# Patient Record
Sex: Male | Born: 1970 | Race: White | Hispanic: No | Marital: Single | Smoking: Never smoker
Health system: Southern US, Community
[De-identification: ages and names within clinical notes are randomized; demographics above are authoritative.]

## PROBLEM LIST (undated history)

## (undated) DIAGNOSIS — E785 Hyperlipidemia, unspecified: Secondary | ICD-10-CM

## (undated) DIAGNOSIS — C6290 Malignant neoplasm of unspecified testis, unspecified whether descended or undescended: Secondary | ICD-10-CM

## (undated) DIAGNOSIS — R1110 Vomiting, unspecified: Secondary | ICD-10-CM

## (undated) DIAGNOSIS — B9681 Helicobacter pylori [H. pylori] as the cause of diseases classified elsewhere: Secondary | ICD-10-CM

## (undated) DIAGNOSIS — K2970 Gastritis, unspecified, without bleeding: Secondary | ICD-10-CM

## (undated) DIAGNOSIS — J309 Allergic rhinitis, unspecified: Secondary | ICD-10-CM

## (undated) DIAGNOSIS — K589 Irritable bowel syndrome without diarrhea: Secondary | ICD-10-CM

## (undated) DIAGNOSIS — F419 Anxiety disorder, unspecified: Secondary | ICD-10-CM

## (undated) DIAGNOSIS — IMO0002 Reserved for concepts with insufficient information to code with codable children: Secondary | ICD-10-CM

## (undated) DIAGNOSIS — K219 Gastro-esophageal reflux disease without esophagitis: Secondary | ICD-10-CM

## (undated) DIAGNOSIS — Z8679 Personal history of other diseases of the circulatory system: Secondary | ICD-10-CM

## (undated) DIAGNOSIS — F79 Unspecified intellectual disabilities: Secondary | ICD-10-CM

## (undated) DIAGNOSIS — IMO0001 Reserved for inherently not codable concepts without codable children: Secondary | ICD-10-CM

## (undated) DIAGNOSIS — F909 Attention-deficit hyperactivity disorder, unspecified type: Secondary | ICD-10-CM

## (undated) HISTORY — DX: Hyperlipidemia, unspecified: E78.5

## (undated) HISTORY — DX: Unspecified intellectual disabilities: F79

## (undated) HISTORY — DX: Malignant neoplasm of unspecified testis, unspecified whether descended or undescended: C62.90

## (undated) HISTORY — DX: Helicobacter pylori (H. pylori) as the cause of diseases classified elsewhere: B96.81

## (undated) HISTORY — DX: Personal history of other diseases of the circulatory system: Z86.79

## (undated) HISTORY — DX: Allergic rhinitis, unspecified: J30.9

## (undated) HISTORY — DX: Helicobacter pylori (H. pylori) as the cause of diseases classified elsewhere: K29.70

## (undated) HISTORY — DX: Irritable bowel syndrome, unspecified: K58.9

## (undated) HISTORY — DX: Attention-deficit hyperactivity disorder, unspecified type: F90.9

## (undated) HISTORY — DX: Anxiety disorder, unspecified: F41.9

## (undated) HISTORY — DX: Reserved for inherently not codable concepts without codable children: IMO0001

## (undated) HISTORY — PX: ESOPHAGOGASTRODUODENOSCOPY: SHX1529

## (undated) HISTORY — DX: Reserved for concepts with insufficient information to code with codable children: IMO0002

## (undated) HISTORY — PX: COLONOSCOPY: SHX174

## (undated) HISTORY — DX: Gastro-esophageal reflux disease without esophagitis: K21.9

## (undated) HISTORY — DX: Vomiting, unspecified: R11.10

---

## 1994-10-27 HISTORY — PX: NASAL SEPTUM SURGERY: SHX37

## 2002-09-21 ENCOUNTER — Encounter (INDEPENDENT_AMBULATORY_CARE_PROVIDER_SITE_OTHER): Payer: Self-pay | Admitting: Specialist

## 2002-09-21 ENCOUNTER — Ambulatory Visit (HOSPITAL_COMMUNITY): Admission: RE | Admit: 2002-09-21 | Discharge: 2002-09-21 | Payer: Self-pay | Admitting: Gastroenterology

## 2007-10-07 ENCOUNTER — Ambulatory Visit: Payer: Self-pay | Admitting: Internal Medicine

## 2007-10-07 LAB — CONVERTED CEMR LAB
AST: 15 units/L (ref 0–37)
Albumin: 4.1 g/dL (ref 3.5–5.2)
Alkaline Phosphatase: 46 units/L (ref 39–117)
BUN: 7 mg/dL (ref 6–23)
Basophils Absolute: 0.2 10*3/uL — ABNORMAL HIGH (ref 0.0–0.1)
CRP, High Sensitivity: 1 (ref 0.00–5.00)
Chloride: 106 meq/L (ref 96–112)
Eosinophils Absolute: 0 10*3/uL (ref 0.0–0.6)
GFR calc non Af Amer: 91 mL/min
HCT: 41.1 % (ref 39.0–52.0)
MCHC: 34.4 g/dL (ref 30.0–36.0)
MCV: 83.9 fL (ref 78.0–100.0)
Monocytes Relative: 2.5 % — ABNORMAL LOW (ref 3.0–11.0)
Neutrophils Relative %: 85.2 % — ABNORMAL HIGH (ref 43.0–77.0)
Platelets: 204 10*3/uL (ref 150–400)
RBC: 4.9 M/uL (ref 4.22–5.81)
RDW: 12.5 % (ref 11.5–14.6)
Sed Rate: 7 mm/hr (ref 0–20)
Sodium: 142 meq/L (ref 135–145)

## 2007-10-18 ENCOUNTER — Encounter: Payer: Self-pay | Admitting: Internal Medicine

## 2007-10-18 ENCOUNTER — Ambulatory Visit: Payer: Self-pay | Admitting: Internal Medicine

## 2007-10-22 DIAGNOSIS — F79 Unspecified intellectual disabilities: Secondary | ICD-10-CM

## 2007-10-22 DIAGNOSIS — I10 Essential (primary) hypertension: Secondary | ICD-10-CM | POA: Insufficient documentation

## 2007-10-22 DIAGNOSIS — F329 Major depressive disorder, single episode, unspecified: Secondary | ICD-10-CM | POA: Insufficient documentation

## 2007-11-10 ENCOUNTER — Ambulatory Visit: Payer: Self-pay | Admitting: Cardiovascular Disease

## 2007-11-23 ENCOUNTER — Ambulatory Visit (HOSPITAL_COMMUNITY): Admission: RE | Admit: 2007-11-23 | Discharge: 2007-11-23 | Payer: Self-pay | Admitting: Internal Medicine

## 2007-12-02 ENCOUNTER — Ambulatory Visit: Payer: Self-pay | Admitting: Internal Medicine

## 2007-12-02 DIAGNOSIS — A048 Other specified bacterial intestinal infections: Secondary | ICD-10-CM | POA: Insufficient documentation

## 2008-09-18 ENCOUNTER — Ambulatory Visit (HOSPITAL_BASED_OUTPATIENT_CLINIC_OR_DEPARTMENT_OTHER): Admission: RE | Admit: 2008-09-18 | Discharge: 2008-09-18 | Payer: Self-pay | Admitting: Urology

## 2008-09-18 ENCOUNTER — Encounter (INDEPENDENT_AMBULATORY_CARE_PROVIDER_SITE_OTHER): Payer: Self-pay | Admitting: Urology

## 2008-09-18 HISTORY — PX: ORCHIECTOMY: SHX2116

## 2008-09-27 ENCOUNTER — Ambulatory Visit: Admission: RE | Admit: 2008-09-27 | Discharge: 2008-12-26 | Payer: Self-pay | Admitting: Radiation Oncology

## 2008-10-05 ENCOUNTER — Ambulatory Visit (HOSPITAL_COMMUNITY): Admission: RE | Admit: 2008-10-05 | Discharge: 2008-10-05 | Payer: Self-pay | Admitting: Radiation Oncology

## 2009-04-05 ENCOUNTER — Ambulatory Visit (HOSPITAL_COMMUNITY): Admission: RE | Admit: 2009-04-05 | Discharge: 2009-04-05 | Payer: Self-pay | Admitting: Radiation Oncology

## 2009-09-26 ENCOUNTER — Ambulatory Visit (HOSPITAL_COMMUNITY): Admission: RE | Admit: 2009-09-26 | Discharge: 2009-09-26 | Payer: Self-pay | Admitting: Radiation Oncology

## 2009-09-26 ENCOUNTER — Ambulatory Visit: Admission: RE | Admit: 2009-09-26 | Discharge: 2009-09-26 | Payer: Self-pay | Admitting: Radiation Oncology

## 2009-09-28 LAB — BETA HCG QUANT (REF LAB): Beta hCG, Tumor Marker: <0.5 m[IU]/mL (ref <5.0)

## 2010-03-08 ENCOUNTER — Ambulatory Visit: Admission: RE | Admit: 2010-03-08 | Discharge: 2010-03-08 | Payer: Self-pay | Admitting: Radiation Oncology

## 2010-03-08 LAB — URINALYSIS, MICROSCOPIC - CHCC
Ketones: NEGATIVE mg/dL
Leukocyte Esterase: NEGATIVE
Nitrite: NEGATIVE
Protein: NEGATIVE mg/dL
RBC count: NEGATIVE (ref 0–2)

## 2010-03-10 LAB — AFP TUMOR MARKER: AFP-Tumor Marker: 4.7 ng/mL (ref 0.0–8.0)

## 2010-03-10 LAB — BETA HCG QUANT (REF LAB): Beta hCG, Tumor Marker: 0.5 m[IU]/mL (ref <5.0)

## 2010-08-30 ENCOUNTER — Other Ambulatory Visit: Payer: Self-pay | Admitting: Radiation Oncology

## 2010-11-17 ENCOUNTER — Encounter: Payer: Self-pay | Admitting: Radiation Oncology

## 2011-02-27 ENCOUNTER — Ambulatory Visit: Payer: Medicaid Other | Attending: Radiation Oncology | Admitting: Radiation Oncology

## 2011-03-11 NOTE — Assessment & Plan Note (Signed)
[name] [name]                         GASTROENTEROLOGY OFFICE NOTE   NAME:[name], [name] HALLEY                         MRN:          [id number]  DATE:10/07/2007                            DOB:          Jan 28, 1971    CHIEF COMPLAINT:  Vomiting, diarrhea weight loss.   ASSESSMENT:  A 40 year old white man with chronic diarrhea, and now with  intermittent but persistent vomiting and weight loss. The etiology is  not entirely clear. The patient is mentally retarded and his mother is  his guardian. It is possible he had some sore of eating disorder or self-  induced vomiting but he had chronic diarrhea prior to that. His  grandmother had Crohn's disease and I am concerned that that may be  possible.   PLAN:  Schedule upper GI endoscopy and colonoscopy to look for reflux  problems, to look for Crohn's disease or other causes of these symptoms.  Ulcerative colitis is possible though I would doubt he would have the  nausea and vomiting. Note that I did not see any trauma to his  fingernails or his hard palate to suggest self-induced vomiting though  that remains a possibility. We will also check a CBC, CMET, TSH, C  reactive protein today.   HISTORY:  This is a 40 year old white man followed by Dr. Elias [name].  His mom relates that in the last year or so he started going to [name] for  classes to maintain his activities of daily living skills, etc. He had  always been self conscious about his weight and he was obese. He started  having nausea and vomiting. It was intermittent. Multiple attempts to  try to observe him inducing vomiting did not result in demonstration of  that. His mother started taking him to work with her on some days. He  did gain some weight after that though he is down about 40 pounds since  April. There apparently is another student in his classes that had this  sort of problem where he was self-inducing vomiting or had an eating  disorder.  There is no bleeding from the rectum or in the vomit. He has  had chronic diarrhea for years and has been managed as irritable bowel  syndrome reasonably well by Dr. Nicholos [name]. His grandmother had Crohn's  disease and was cared for by Dr. Corinda [name]. The patient's mother does have  irritable bowel syndrome. He has pill dysphagia and his throat feels  sore at times but there is no esophageal food dysphagia. He did have a  history of binging on food in years past.   PAST MEDICAL HISTORY:  Hypertension, depression (he is upset over his  grandfather passing away last evening), nasal septal surgery, mental  retardation.   FAMILY HISTORY:  As above.   MEDICATIONS:  1. Prevacid 30 mg daily.  2. Ranitidine 300 mg q.h.s.  3. Benicar 40/12.5 mg daily.  There are no known drug allergies.   SOCIAL HISTORY:  He lives with his mom. No alcohol, tobacco or drugs.   REVIEW OF SYSTEMS:  See medical history form. He does have some  cough.  He is a little anxious and upset over his grandfather's death. See  medical history form for further details.   PHYSICAL EXAMINATION:  Reveals a pleasant, well-developed, young, white  man.  Height 6 feet, weight 222 pounds. Blood pressure 138/92, pulse 60.  EYES:  Anicteric.  ENT:  I see a normal mouth and posterior pharynx. I see no trauma on the  palate.  NECK:  Supple, no thyromegaly or mass.  CHEST:  Clear.  HEART:  S1, S2, no gallops.  ABDOMEN:  Soft. There is striae present. There is no organomegaly or  mass. Nontender.  RECTAL:  Inspection of the rectal area shows no significant  abnormalities though it was a little hard to open the buttocks and  really see the anal area. Digital rectal exam is deferred.  LYMPHATIC:  No neck or supraclavicular or groin nodes detected.  SKIN:  Some acne in the back otherwise negative.  NEURO/PSYCHE:  He is awake and alert, grossly nonfocal neurologically.   I appreciate the opportunity to care for this patient.     Iva Boop, MD,FACG  Electronically Signed    CEG/MedQ  DD: 10/07/2007  DT: 10/08/2007  Job #: [id number]

## 2011-03-11 NOTE — Assessment & Plan Note (Signed)
[facility]                         GASTROENTEROLOGY OFFICE NOTE   NAME:[name] [name], [name] [name]                         MRN:          [id number]  DATE:12/02/2007                            DOB:          [birth year]    CHIEF COMPLAINT:  Followup of weight-loss, vomiting.   [name] continues to have some vomiting.  He has had chronic diarrhea  prior to all this.  His weight is down 15 pounds from when I saw him in  September.  He is here with his mom.  He says he is doing better.  However, she says he goes for days without eating, though he is  drinking.  I had seen him first on December 11.  Subsequently, he has  had an EGD with some thickened proximal folds that showed H. pylori  gastritis.  He had a colonoscopy, normal into the terminal ileum.  Random biopsies of the colon and the ileum were normal.  Because of the  weight-loss and problems, a CT of the abdomen and pelvis was performed  and it showed some liver lesions and it was not clear what they were and  an MRI was performed that showed that these were consistent with  hemangiomas.  His pancreas and other organs look fine.  There was some  thickening of the gastric wall and the colon on some of these studies,  but between those studies and the colonoscopy and EGD, it is concluded  that there really is no significant abnormality there.   MEDICATIONS:  1. Remain Prevacid 300 mg daily.  2. Ranitidine 300 mg q.h.s.  3. Benicar 40/12.5 mg daily, which is on hold.  4. Flonase 120 mcg p.r.n.   ALLERGIES:  There are no known drug allergies.   PHYSICAL EXAM:  Weight 207 pounds, pulse 56, blood pressure 132/90.   ASSESSMENT:  1. Helicobacter pylori gastritis.  We will treat that with Prevacid      b.i.d. and amoxicillin and Biaxin 1000 mg and 500 mg b.i.d.,      respectively, for ten days.  This may help, but I told his mom I      doubt that would fix everything.  2. Persistent vomiting.  3. Persistent  weight-loss.  4. Irritable bowel syndrome, diarrhea-predominant, seems most likely.      Given all the imaging studies we have had, I do not think further      workup of the diarrhea itself is needed.   I would like to him to go back to see Dr. Nicholos Johns and consider perhaps a  psychological or psychiatric assessment for eating difficulty.  This is  very difficult in the face of his underlying mental retardation.  Note  that his grandfather died in 11/05/23, and I do not know what happened  prior to this with his illness, etc., but that may have been a trigger  for some sort of depression.  I can see him back after he sees Dr.  Nicholos Johns.  Again, I doubt the H. pylori treatment  will fix everything.  Hopefully that will provide some benefit, but I  would not perform any GI-specific evaluation further at this time.     Iva Boop, MD,FACG  Electronically Signed    CEG/MedQ  DD: 12/02/2007  DT: 12/03/2007  Job #: [id number]   cc:   [name], M.D.

## 2011-03-11 NOTE — Op Note (Signed)
NAME:  [name] [name], [name] [name]                  ACCOUNT NO.:  [id number]   MEDICAL RECORD NO.:  [id number]          PATIENT TYPE:  AMB   LOCATION:  [facility]                         FACILITY:  [facility]   PHYSICIAN:  Sigmund I. Patsi Sears, M.D.DATE OF BIRTH:  Mar 23, 1971   DATE OF PROCEDURE:  09/18/2008  DATE OF DISCHARGE:                               OPERATIVE REPORT   PREOPERATIVE DIAGNOSIS:  Right testis mass.   POSTOPERATIVE DIAGNOSIS:  Germ cell tumor (frozen section).   OPERATION:  Right radical orchiectomy.   SURGEON:  Sigmund I. Patsi Sears, M.D.   ANESTHESIA:  General LMA.   OPERATION:  After appropriate preanesthesia, the patient was brought to  the operating room and placed on the operating room table in a dorsal  supine position where general LMA anesthesia was introduced.  He  remained in the supine position, where the inguinal area was shaved, and  prepped with Betadine solution and draped in usual fashion.   REVIEW OF HISTORY:  This 40 year old mentally challenged male was  referred by [name], M.D. at [facility], for evaluation of  right testicular mass.  Ultrasound at [facility] suggested  carcinoma.  Ultrasound in the office revealed a 4.4 x 2.93 x 2.8 cm  intratesticular mass.  The right testicle size was 5.3 x 3.04 x 3.47 cm.  The left testicle was normal.  The patient underwent laboratory  examination, with a beta hCG which was less than 1, and then alpha  fetoprotein which was 2.5 (normal less than 6.1).  He is now for right  radical orchiectomy.   PROCEDURE:  A 5 cm right inguinal incision was made with subcutaneous  tissue dissected.  Bleeding was controlled with the electrosurgical  unit.  The external oblique fascia was identified, incised sharply, and  dissected, in order to avoid injury to the ilioinguinal nerve.  Following this, the spermatic cord was identified at the level of the  pubic tubercle, and a Penrose drain was doubly placed around  the  spermatic cord, and locked in position.  The scrotum was dissected, and  testicle delivered into the wound.  The testicle was hard, as it had  appeared clinically.  The patient was previously marked on the right  side.  The spermatic cord was then dissected, and quadrupley clamped,  and the testicle amputated.  The 0.5 plain Marcaine was injected into  the spermatic cord, and the testicle was sent for frozen section  analysis.  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  Following this, the wound edges were injected  with Marcaine solution.  Following this, the clamps were ligated with  zero Vicryl suture, and also #1 nylon suture, which was left long, in  case future radical dissection was necessary.  No scrotal bleeding was  noted.  The external oblique fascia was then closed with running zero  Vicryl  suture, subcutaneous tissue was closed with 3-0 Vicryl suture, skin was  closed with running 4-0 Vicryl suture.  Dermabond was placed on the  wound.  The patient was given IV  Toradol, awakened, and taken recovery  room in good condition.      Sigmund I. Patsi Sears, M.D.  Electronically Signed     SIT/MEDQ  D:  09/18/2008  T:  09/18/2008  Job:  [id number]

## 2011-03-14 NOTE — Op Note (Signed)
NAME:  [name] [name], [name] [name]                            ACCOUNT NO.:  [id number]   MEDICAL RECORD NO.:  [id number]                   PATIENT TYPE:  AMB   LOCATION:  ENDO                                 FACILITY:  [facility]   PHYSICIAN:  Petra Kuba, M.D.                 DATE OF BIRTH:  1971/01/13   DATE OF PROCEDURE:  09/21/2002  DATE OF DISCHARGE:                                 OPERATIVE REPORT   PROCEDURE:  Esophagogastroduodenoscopy with biopsy.   INDICATIONS:  Atypical chest pain, upper tract symptoms.  Consent was signed  after risks, benefits, methods, and options thoroughly discussed with the  patient and his mother.   MEDICATIONS:  Demerol 100 mg, Versed 10 mg.   DESCRIPTION OF PROCEDURE:  The video endoscope was inserted by direct  vision.  The esophagus was normal.  In the distal esophagus was a tiny to  small hiatal hernia.  The scope passed into the stomach, advanced to the  antrum, through a normal pylorus, into a normal duodenal bulb, and around  the C-loop to a normal second portion of the duodenum.  The scope was  withdrawn back to the bulb, and a good look there ruled out ulcer in that  location.  The scope was withdrawn back to the stomach and retroflexed.  High in the cardia the finding of the small hiatal hernia was confirmed.  There was some proximal gastritis.  Angularis, lesser and greater curve were  normal except for some minimal gastritis.  The scope was straightened, and  straight visualization of the stomach confirmed the gastritis, ruled out any  additional lesions.  Air was suctioned, the scope was slowly withdrawn.  Again a good look at the esophagus was normal.  The scope was then  readvanced to the stomach.  Two biopsies of the antrum and two of the  proximal stomach were obtained to rule out Helicobacter.  Air was suctioned,  and the scope was slowly withdrawn.  Again a good look at the esophagus was  normal.  The scope was removed.  The patient tolerated  the procedure well.  There was no obvious immediate complication.   ENDOSCOPIC DIAGNOSES:  1. Tiny to small hiatal hernia.  2. Mild distal with moderate proximal gastritis, status post biopsy.  3. Otherwise normal EGD.   PLAN:  Protonix b.i.d.  Await pathology.  Minimize aspirin and  nonsteroidals.  Follow up p.r.n. or in two months.                                               Petra Kuba, M.D.    MEM/MEDQ  D:  09/21/2002  T:  09/21/2002  Job:  [id number]   cc:   [name], M.D.  [address]  [place]  [place] [address]  Fax: [phone]

## 2011-04-01 ENCOUNTER — Ambulatory Visit (HOSPITAL_COMMUNITY)
Admission: RE | Admit: 2011-04-01 | Discharge: 2011-04-01 | Disposition: A | Discharge status: Home / Self Care | Payer: Medicaid Other | Source: Ambulatory Visit | Attending: Urology | Admitting: Urology

## 2011-04-01 ENCOUNTER — Other Ambulatory Visit (HOSPITAL_COMMUNITY): Payer: Self-pay | Admitting: Urology

## 2011-04-01 DIAGNOSIS — R079 Chest pain, unspecified: Secondary | ICD-10-CM | POA: Insufficient documentation

## 2011-04-01 DIAGNOSIS — R52 Pain, unspecified: Secondary | ICD-10-CM

## 2011-05-08 ENCOUNTER — Other Ambulatory Visit: Payer: Self-pay | Admitting: Urology

## 2011-05-08 DIAGNOSIS — R1900 Intra-abdominal and pelvic swelling, mass and lump, unspecified site: Secondary | ICD-10-CM

## 2011-05-11 ENCOUNTER — Ambulatory Visit
Admission: RE | Admit: 2011-05-11 | Discharge: 2011-05-11 | Disposition: A | Discharge status: Home / Self Care | Payer: Medicaid Other | Source: Ambulatory Visit | Attending: Urology | Admitting: Urology

## 2011-05-11 DIAGNOSIS — R1900 Intra-abdominal and pelvic swelling, mass and lump, unspecified site: Secondary | ICD-10-CM

## 2011-05-11 MED ORDER — GADOBENATE DIMEGLUMINE 529 MG/ML IV SOLN
20.0000 mL | Freq: Once | INTRAVENOUS | Status: AC | PRN
  Administered 2011-05-11: 20 mL via INTRAVENOUS

## 2011-07-30 LAB — POCT HEMOGLOBIN-HEMACUE: Hemoglobin: 15.5

## 2012-03-02 ENCOUNTER — Encounter: Payer: Self-pay | Admitting: Radiation Oncology

## 2012-03-04 ENCOUNTER — Ambulatory Visit: Payer: Medicaid Other | Admitting: Radiation Oncology

## 2012-03-18 ENCOUNTER — Encounter: Payer: Self-pay | Admitting: Radiation Oncology

## 2012-03-18 ENCOUNTER — Ambulatory Visit
Admission: RE | Admit: 2012-03-18 | Discharge: 2012-03-18 | Disposition: A | Discharge status: Home / Self Care | Payer: Medicaid Other | Source: Ambulatory Visit | Attending: Radiation Oncology | Admitting: Radiation Oncology

## 2012-03-18 DIAGNOSIS — C6290 Malignant neoplasm of unspecified testis, unspecified whether descended or undescended: Secondary | ICD-10-CM

## 2012-03-18 DIAGNOSIS — C6291 Malignant neoplasm of right testis, unspecified whether descended or undescended: Secondary | ICD-10-CM | POA: Insufficient documentation

## 2012-03-18 DIAGNOSIS — K589 Irritable bowel syndrome without diarrhea: Secondary | ICD-10-CM

## 2012-03-18 NOTE — Patient Instructions (Signed)
May need AFP and BCG for surveillance

## 2012-03-18 NOTE — Progress Notes (Signed)
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 Denies pain.No problems per mother except 1 episode of urinary symptoms consistent with enlarged prostate.

## 2012-03-18 NOTE — Progress Notes (Signed)
  Radiation Oncology         [phone] ________________________________  Name: [name] [name] MRN: [id number]  Date: 03/18/2012  DOB: 10-08-1971  Follow-Up Visit Note  CC: No primary provider on file.  Jethro Bolus I, *  Diagnosis:   41 year old gentleman with a history of right stage I testicular seminoma  Interval Since Last Radiation:  40 months  Narrative:  The patient returns today for routine follow-up.  He is essentially without complaints. He did have some increased urinary frequency and hesitancy, but this has been better recently.                              ALLERGIES:   has no known allergies.  Meds: Current Outpatient Prescriptions  Medication Sig Dispense Refill  . fluticasone (FLONASE) 50 MCG/ACT nasal spray Place 2 sprays into the nose daily.      . lansoprazole (PREVACID) 30 MG capsule Take 30 mg by mouth daily.      . ranitidine (ZANTAC) 150 MG capsule Take 150 mg by mouth every evening.        Physical Findings: The patient is in no acute distress.  Patient is alert and oriented.The supraclavicular regions for adenopathy lungs are clear heart is regular abdomen is soft nontender. The patient does have visible striae along the abdomen presumably related to weight gain. The abdomen is benign. The inguinal canals are free of adenopathy. Phallus circumcised and unremarkable. The scrotum contains the left testicle which is smooth and nontender. Extremities are free of cyanosis clubbing or edema.. No significant changes.  Lab Findings: Lab Results  Component Value Date   WBC 8.0 10/07/2007   HGB 15.5 09/18/2008   HCT 41.1 10/07/2007   MCV 83.9 10/07/2007   PLT 204 10/07/2007   Impression:  The patient currently exhibits no clinical evidence to suggest recurrence.  Plan:  The patient will return to radiation oncology clinic for routine followup in one year. He will see Dr. Patsi Sears in the next week or so.  _____________________________________  Artist Pais. Kathrynn Running, M.D.

## 2012-04-21 ENCOUNTER — Other Ambulatory Visit (HOSPITAL_COMMUNITY): Payer: Self-pay | Admitting: Urology

## 2012-04-21 ENCOUNTER — Ambulatory Visit (HOSPITAL_COMMUNITY)
Admission: RE | Admit: 2012-04-21 | Discharge: 2012-04-21 | Disposition: A | Discharge status: Home / Self Care | Payer: Medicaid Other | Source: Ambulatory Visit | Attending: Urology | Admitting: Urology

## 2012-04-21 DIAGNOSIS — C6290 Malignant neoplasm of unspecified testis, unspecified whether descended or undescended: Secondary | ICD-10-CM | POA: Insufficient documentation

## 2013-03-24 ENCOUNTER — Ambulatory Visit: Payer: Medicaid Other | Admitting: Radiation Oncology

## 2013-05-05 ENCOUNTER — Encounter: Payer: Self-pay | Admitting: Radiation Oncology

## 2013-05-05 ENCOUNTER — Ambulatory Visit
Admission: RE | Admit: 2013-05-05 | Discharge: 2013-05-05 | Disposition: A | Discharge status: Home / Self Care | Payer: Medicaid Other | Source: Ambulatory Visit | Attending: Radiation Oncology | Admitting: Radiation Oncology

## 2013-05-05 VITALS — BP 146/94 | HR 61 | Temp 96.5°F | Resp 20 | Wt 231.0 lb

## 2013-05-05 DIAGNOSIS — C6290 Malignant neoplasm of unspecified testis, unspecified whether descended or undescended: Secondary | ICD-10-CM

## 2013-05-05 NOTE — Progress Notes (Signed)
  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  He is without complaint.  He has been exercising more and lifting weights.  He does experience diarrhea with certain foods.                              ALLERGIES:  has No Known Allergies.  Meds: Current Outpatient Prescriptions  Medication Sig Dispense Refill  . fluticasone (FLONASE) 50 MCG/ACT nasal spray Place 2 sprays into the nose daily.      . lansoprazole (PREVACID) 30 MG capsule Take 30 mg by mouth daily.      . ranitidine (ZANTAC) 150 MG capsule Take 150 mg by mouth every evening.       No current facility-administered medications for this encounter.    Physical Findings: The patient is in no acute distress. Patient is alert and oriented.  weight is 231 lb (104.781 kg). His temperature is 96.5 F (35.8 C). His blood pressure is 146/94 and his pulse is 61. His respiration is 20. . The supraclavicular region is free of adenopathy.  Lungs are clear.  Heart is regular.  Abdomen is protuberant, soft, nontender.  Radiation tattoos are visible and there are no other skin findings of note.  Bilateral groins are free of adenopathy.  The phallus is circumcised, unremarkable.  The scrotum contains left testicle which is generally large without distinct nodularity or tenderness.  The right hemiscrotum is empty.  There are no skin changes of the scrotum or groin. No significant changes.  Lab Findings: Lab Results  Component Value Date   WBC 8.0 10/07/2007   HGB 15.5 09/18/2008   HCT 41.1  10/07/2007   MCV 83.9 10/07/2007   PLT 204 10/07/2007    Impression:  The patient has no evidence of recurrence.  Plan:  He will return for follow-up in one year.  His mother asked about follow-up CT imaging today.  I reassured her that routine CT surveillance is not recommended by NCCN guidelines for patients who undergo radiation to the paraaortics and ipsilateral pelvis given the exceedingly low likelihood of recurrence in those nodal areas.  _____________________________________  Artist Pais. Kathrynn Running, M.D.

## 2013-05-05 NOTE — Progress Notes (Signed)
Pt's caregiver w/him today. Pt denies pain, urinary, bowel issues, fatigue, loss of appetite.

## 2013-06-08 ENCOUNTER — Telehealth: Payer: Self-pay | Admitting: Radiation Oncology

## 2013-06-08 NOTE — Telephone Encounter (Signed)
Returned message left by patient's mother. She reports she was reviewing her son's MyChart and found a note where Dr. Kathrynn Running recommended AFP and BCG but, know one has not been done since 2011. Informed the [name] this Clinical research associate would relay her concerns to Dr. Kathrynn Running. Also, explain what an AFP and BCG is. All questions answered. She verbalized understanding. Will contact patient's mother next week if Dr. Kathrynn Running wishes to repeat these labs.

## 2013-06-13 ENCOUNTER — Telehealth: Payer: Self-pay | Admitting: Radiation Oncology

## 2013-06-13 NOTE — Telephone Encounter (Signed)
Dr. Imelda Pillow office has obtained these tests annually. The most recent results were 04/27/13 which can be found in the patient's media tab, excerpted below.  MM   Phoned patient's mother and explained per Dr. Broadus John direction that Dr. Imelda Pillow office has obtained these tests annually. Explained the results were within normal limits. She verbalized understanding and expressed appreciation for the call and clarification.

## 2013-06-13 NOTE — Telephone Encounter (Signed)
Thanks Sam,  Dr. Imelda Pillow office has obtained these tests annually.  The most recent results were 04/27/13 which can be found in the patient's media tab, excerpted below.  MM

## 2014-06-01 ENCOUNTER — Encounter: Payer: Self-pay | Admitting: Radiation Oncology

## 2014-06-01 ENCOUNTER — Ambulatory Visit
Admission: RE | Admit: 2014-06-01 | Discharge: 2014-06-01 | Disposition: A | Discharge status: Home / Self Care | Payer: Medicaid Other | Source: Ambulatory Visit | Attending: Radiation Oncology | Admitting: Radiation Oncology

## 2014-06-01 VITALS — BP 151/90 | HR 61 | Resp 16 | Wt 207.4 lb

## 2014-06-01 DIAGNOSIS — C6291 Malignant neoplasm of right testis, unspecified whether descended or undescended: Secondary | ICD-10-CM

## 2014-06-01 NOTE — Progress Notes (Signed)
  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  Mother reports they are presently working with [name], PCP, on patient's new eating disorder. Reports the patient is purging food. Patient has lost 23 lb since last being seen one year ago. Patient reports he continues to exercise and lift weights. Blood pressure elevated today. Patient persistently complaining he got "too hot today at summer camp." Endoscopy scheduled for November                               ALLERGIES:  has No Known Allergies.  Meds: Current Outpatient Prescriptions  Medication Sig Dispense Refill  . fluticasone (FLONASE) 50 MCG/ACT nasal spray Place 2 sprays into the nose daily.      . lansoprazole (PREVACID) 30 MG capsule Take 30 mg by mouth daily.      . ranitidine (ZANTAC) 150 MG capsule Take 150 mg by mouth every evening.      . rosuvastatin (CRESTOR) 10 MG tablet Take 10 mg by mouth daily.       No current facility-administered medications for this encounter.    Physical Findings: The patient is in no acute distress. Patient is alert and oriented.  weight is 207 lb 6.4 oz (94.076 kg). His blood pressure is 151/90 and his pulse is 61. His respiration is 16. .The supraclavicular region is free of adenopathy. Lungs are clear. Heart is regular. Abdomen is protuberant, soft, nontender. Radiation tattoos are visible and there are no other skin findings of note. Bilateral groins are free of adenopathy. The phallus is circumcised, unremarkable. The scrotum contains left  testicle which is generally large without distinct nodularity or tenderness. The right hemiscrotum is empty. There are no skin changes of the scrotum or groin. No significant change  Lab Findings: Lab Results  Component Value Date   WBC 8.0 10/07/2007   HGB 15.5 09/18/2008   HCT 41.1 10/07/2007   MCV 83.9 10/07/2007   PLT 204 10/07/2007    Radiographic Findings: CT abd/pelvis on 7/24 at [facility] shows no recurrence.  Impression:  The patient has no evidence of recurrence.  Plan:  Follow-up in one year, preceded by lab work.  _____________________________________  Sheral Apley. Tammi Klippel, M.D.

## 2014-06-01 NOTE — Progress Notes (Signed)
Mother reports they are presently working with Dr. Mariea Clonts, PCP, on patient's new eating disorder. Reports the patient is purging food. Patient has lost 23 lb since last being seen one year ago. Patient reports he continues to exercise and lift weights. Blood pressure elevated today. Patient persistently complaining he got "too hot today at summer camp." Endoscopy scheduled for November.

## 2014-06-02 ENCOUNTER — Telehealth: Payer: Self-pay | Admitting: *Deleted

## 2014-06-02 NOTE — Telephone Encounter (Signed)
CALLED PATIENT TO INFORM OF LAB AND FU VISIT, LVM FOR A RETURN CALL

## 2014-06-16 ENCOUNTER — Encounter: Payer: Self-pay | Admitting: Internal Medicine

## 2014-08-17 ENCOUNTER — Ambulatory Visit (INDEPENDENT_AMBULATORY_CARE_PROVIDER_SITE_OTHER): Payer: Medicaid Other | Admitting: Internal Medicine

## 2014-08-17 ENCOUNTER — Encounter: Payer: Self-pay | Admitting: Internal Medicine

## 2014-08-17 VITALS — BP 136/100 | HR 72 | Ht 70.0 in | Wt 205.1 lb

## 2014-08-17 DIAGNOSIS — R634 Abnormal weight loss: Secondary | ICD-10-CM

## 2014-08-17 DIAGNOSIS — F508 Other eating disorders: Secondary | ICD-10-CM

## 2014-08-17 DIAGNOSIS — K589 Irritable bowel syndrome without diarrhea: Secondary | ICD-10-CM

## 2014-08-17 DIAGNOSIS — F5089 Other specified eating disorder: Secondary | ICD-10-CM

## 2014-08-18 ENCOUNTER — Encounter: Payer: Self-pay | Admitting: Internal Medicine

## 2014-08-18 DIAGNOSIS — K589 Irritable bowel syndrome without diarrhea: Secondary | ICD-10-CM | POA: Insufficient documentation

## 2014-08-18 DIAGNOSIS — R634 Abnormal weight loss: Secondary | ICD-10-CM | POA: Insufficient documentation

## 2014-08-18 DIAGNOSIS — F5089 Other specified eating disorder: Secondary | ICD-10-CM | POA: Insufficient documentation

## 2014-08-18 NOTE — Progress Notes (Signed)
   Subjective:    Patient ID: [name] [name], male    DOB: 15-Jul-1971, 43 y.o.   MRN: [id number]  HPI 43 yo developmentaly delayed man here with mother. He has hx GERD and IBS. She reports he is inducing vomiting after eating about 50% of time and is losing weight. Fr. Alyson Ingles is helping with finding a Careers information officer. Mom wants to know if there is specific GI danger from this behavior plus if there is anything I could recommend to help with problem an stop it,  He also has chronic diarrhea. He has had at least 2 EGD (2003 and 2008) - H. Pylori gastritis Tx 2008 Colonoscopy/ileoscopy 2008 negative including colon bx  He is losing weight No Known Allergies Outpatient Prescriptions Prior to Visit  Medication Sig Dispense Refill  . fluticasone (FLONASE) 50 MCG/ACT nasal spray Place 2 sprays into the nose daily.      . lansoprazole (PREVACID) 30 MG capsule Take 30 mg by mouth daily.      . ranitidine (ZANTAC) 150 MG capsule Take 150 mg by mouth every evening.      . rosuvastatin (CRESTOR) 10 MG tablet Take 10 mg by mouth daily.       No facility-administered medications prior to visit.   Past Medical History  Diagnosis Date  . Mentally challenged   . H/O: hypertension   . Radiation 09/27/08-11/09/2008    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  . GERD (gastroesophageal reflux disease)   . Anxiety   . Testicular cancer     s/p orchiectomy  . Allergic rhinitis   . 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 HLD (hyperlipidemia)   . Helicobacter pylori gastritis     hx of  . IBS (irritable bowel syndrome)   . Vomiting     self-induced   Past Surgical History  Procedure Laterality Date  . Nasal septum surgery  1996    x 2  . Orchiectomy  09/18/2008    right inguinal   . Colonoscopy    . Esophagogastroduodenoscopy           Review of Systems As above    Objective:   Physical Exam General:  NAD Eyes:   anicteric Lungs:  clear Heart:  S1S2 no rubs, murmurs or  gallops Abdomen:  soft and nontender, BS+ Ext:   no edema    Data Reviewed:   Wt Readings from Last 3 Encounters:  08/17/14 205 lb 2 oz (93.044 kg)  06/01/14 207 lb 6.4 oz (94.076 kg)  05/05/13 231 lb (104.781 kg)      Weight in 2009 was 207#    Assessment & Plan:  Self induced vomiting - chronic Chronic and recurrent and behavioral. Should follow-through with therapists recommended by [name]. Challenging problem. ? If some/all centers around fear of not being with mom.  IBS (irritable bowel syndrome) Episodic chronic diarrhea. Colon bxs negative in past.  Loss of weight His weight is where he was in 2009 - though was higher last year. This is coming from self-induced vomiting. Hopefully behavioral therapy can help.    No need for endoscopic evaluation. See me prn Cc: Vena Austria, MD

## 2014-08-18 NOTE — Assessment & Plan Note (Signed)
Episodic chronic diarrhea. Colon bxs negative in past.

## 2014-08-18 NOTE — Assessment & Plan Note (Signed)
His weight is where he was in 2009 - though was higher last year. This is coming from self-induced vomiting. Hopefully behavioral therapy can help.

## 2014-08-18 NOTE — Assessment & Plan Note (Signed)
Chronic and recurrent and behavioral. Should follow-through with therapists recommended by [name]. Challenging problem. ? If some/all centers around fear of not being with mom.

## 2015-06-06 ENCOUNTER — Ambulatory Visit
Admission: RE | Admit: 2015-06-06 | Discharge: 2015-06-06 | Disposition: A | Discharge status: Home / Self Care | Payer: Medicaid Other | Source: Ambulatory Visit | Attending: Radiation Oncology | Admitting: Radiation Oncology

## 2015-06-06 ENCOUNTER — Other Ambulatory Visit: Payer: Self-pay | Admitting: Radiation Oncology

## 2015-06-06 DIAGNOSIS — C6291 Malignant neoplasm of right testis, unspecified whether descended or undescended: Secondary | ICD-10-CM | POA: Diagnosis not present

## 2015-06-06 LAB — LACTATE DEHYDROGENASE (CC13): LDH: 192 U/L (ref 125–245)

## 2015-06-07 ENCOUNTER — Encounter: Payer: Self-pay | Admitting: Radiation Oncology

## 2015-06-07 ENCOUNTER — Ambulatory Visit
Admission: RE | Admit: 2015-06-07 | Discharge: 2015-06-07 | Disposition: A | Discharge status: Home / Self Care | Payer: Medicaid Other | Source: Ambulatory Visit | Attending: Radiation Oncology | Admitting: Radiation Oncology

## 2015-06-07 VITALS — BP 159/98 | HR 98 | Temp 98.0°F | Resp 20 | Ht 70.0 in | Wt 223.6 lb

## 2015-06-07 DIAGNOSIS — C6291 Malignant neoplasm of right testis, unspecified whether descended or undescended: Secondary | ICD-10-CM

## 2015-06-07 LAB — AFP TUMOR MARKER: AFP-Tumor Marker: 5.2 ng/mL (ref <6.1)

## 2015-06-07 LAB — HCG, SERUM, QUALITATIVE: PREG SERUM: NEGATIVE

## 2015-06-07 NOTE — Progress Notes (Signed)
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 S/p paraaortic radiotherapy to 22.5 Gy in 15 fractions from 10/17/2008-11/09/2008   Narrative:  The patient returns today for routine follow-up. C/o of leg pain. No c/o of nausea. Regular bowels. No c/o of voiding. Drinks gatoraid, tea, and water. Good appetite. C/o leg muscle pain that he attributes to his new cholesterol medication. He will call his Primary MD, [name], about it.                              ALLERGIES:  has No Known Allergies.  Meds: Current Outpatient Prescriptions  Medication Sig Dispense Refill  . atorvastatin (LIPITOR) 10 MG tablet Take 10 mg by mouth daily.  0  . fluticasone (FLONASE) 50 MCG/ACT nasal spray Place 2 sprays into the nose daily.    . lansoprazole (PREVACID) 30 MG capsule Take 30 mg by mouth daily.    . ranitidine (ZANTAC) 150 MG capsule Take 150 mg by mouth every evening.    Marland Kitchen LORazepam (ATIVAN) 0.5 MG tablet Take 0.5 mg by mouth 2 (two) times daily.  0   No current facility-administered medications for this encounter.    Physical Findings: The patient is in no acute distress. Patient is alert and oriented.  height is 5\' 10"  (1.778 m) and weight is 223 lb 9.6 oz (101.424 kg). His oral temperature is 98 F (36.7 C). His blood pressure is 159/98 and his pulse is 98. His respiration is 20 and oxygen saturation is 98%.  The patient's head is normocephalic and atraumatic. The patient is neurologically grossly intact. Speech is fluent and articulate. The patient's mood and affect are appropriate. The supraclavicular region is free of adenopathy. Lungs are clear. Heart is regular. Abdomen is  protuberant, soft, nontender. Radiation tattoos are visible and there are no other skin findings of note. Bilateral groins are free of adenopathy. The phallus is circumcised, unremarkable. The scrotum contains left testicle which is generally large without distinct nodularity or tenderness. The right hemiscrotum is empty. There are no skin changes of the scrotum or groin. No significant change.  Lab Findings: Lab Results  Component Value Date   WBC 8.0 10/07/2007   HGB 15.5 09/18/2008   HCT 41.1 10/07/2007   MCV 83.9 10/07/2007   PLT 204 10/07/2007  Results for [name] [name], [name] [name] (MRN [id number]) as of 06/07/2015 18:29  Ref. Range 06/06/2015 14:39  LDH Latest Ref Range: 125-245 U/L 192  Results for [name] [name], [name] [name] (MRN [id number]) as of 06/07/2015 18:29  Ref. Range 06/06/2015 14:39  AFP Tumor Marker Latest Ref Range: <6.1 ng/mL 5.2  Results for [name] [name], [name] [name] (MRN [id number]) as of 06/07/2015 18:29  Ref. Range 08/30/2010 15:32  Beta hCG, Tumor Marker Latest Ref Range: < 5.0 mIU/mL 0.5  Results for [name] [name], [name] [name] (MRN [id number]) as of 06/07/2015 18:29  Ref. Range 06/06/2015 14:39  HCG, Serum Unknown NEG    Impression:  The patient has no evidence of recurrence.  His markers are negative.  His HCG was ordered as qualitative rather than quantitative, but, it was negative regardless.  Plan:  Continue annual follow ups. Alternate with urology. Therefore, return here in two years for markers and visit.  This document serves as a record of services personally performed by Tyler Pita, MD. It was created on his behalf by Darcus Austin, a trained medical scribe. The creation of this record is based on the scribe's personal observations and the provider's statements to them. This document has been checked and approved by the attending provider.     _____________________________________  Sheral Apley. Tammi Klippel, M.D.

## 2015-06-07 NOTE — Progress Notes (Signed)
Follow up s/p rad txs 10/17/08-11/09/2008 right sided seminoma, no c/o pain or nausea,  Or pain, regular bowels, no c/o voiding, drinks gator aid ,tea and water, appetite good, c/o leg muscle pain, started new cholesterol medication 2 months ago, will call his Primary MD Dr. Mariea Clonts with [facility] 4:30 PM BP 159/98 mmHg  Pulse 98  Temp(Src) 98 F (36.7 C) (Oral)  Resp 20  Ht 5\' 10"  (1.778 m)  Wt 223 lb 9.6 oz (101.424 kg)  BMI 32.08 kg/m2  SpO2 98%  Wt Readings from Last 3 Encounters:  06/07/15 223 lb 9.6 oz (101.424 kg)  08/17/14 205 lb 2 oz (93.044 kg)  06/01/14 207 lb 6.4 oz (94.076 kg)

## 2016-06-12 ENCOUNTER — Telehealth: Payer: Self-pay | Admitting: Radiation Oncology

## 2016-06-12 ENCOUNTER — Ambulatory Visit
Admission: RE | Admit: 2016-06-12 | Discharge: 2016-06-12 | Disposition: A | Discharge status: Home / Self Care | Payer: Medicaid Other | Source: Ambulatory Visit | Attending: Radiation Oncology | Admitting: Radiation Oncology

## 2016-06-12 NOTE — Telephone Encounter (Signed)
Phoned Ms. Flye back. Explained that Dr. Tammi Klippel wants to get labs the day of the follow up and a separate appointment isn't required.

## 2016-06-12 NOTE — Progress Notes (Signed)
No show

## 2016-06-12 NOTE — Telephone Encounter (Signed)
Patient has not shown for 1615 yearly follow up appointment with Dr. Tammi Klippel. Phoned patient's mother, Britt Boozer, out of concern. She reports she forgot all about the appointment. Offered to reschedule. She prefers to call back once she has her day planner in front of her to reschedule. Confirmed Eren hasn't been seen by [facility] since 2015. She questions if Dr. Tammi Klippel wants to draw Argel's lab work prior to the appointment. She understands this RN will ask Dr. Tammi Klippel and call her back with further direction.

## 2016-06-13 ENCOUNTER — Ambulatory Visit: Admission: RE | Admit: 2016-06-13 | Payer: Medicaid Other | Source: Ambulatory Visit | Admitting: Radiation Oncology

## 2016-07-14 ENCOUNTER — Ambulatory Visit: Admission: RE | Admit: 2016-07-14 | Payer: Medicaid Other | Source: Ambulatory Visit | Admitting: Radiation Oncology

## 2016-07-14 ENCOUNTER — Telehealth: Payer: Self-pay | Admitting: Radiation Oncology

## 2016-07-14 NOTE — Telephone Encounter (Signed)
07/14/2016 Appointment canceled per patient / mother request. Per work  conflict.

## 2016-07-16 ENCOUNTER — Telehealth: Payer: Self-pay | Admitting: *Deleted

## 2016-07-30 NOTE — Progress Notes (Signed)
[name] [name] is here for an annual follow up visit for malignant neoplasm of the testis.  Denies pain or discomfort in the scrotum and groin area.   Having normal bowel and bladder patterns with occasional diarrhea or constipation.   Appetite is good.  Having fatigue some days when he has been very active.  Hcg drawn one year ago and saw Dr Gaynelle Arabian 06-07-15. Wt Readings from Last 3 Encounters:  07/31/16 221 lb 6.4 oz (100.4 kg)  06/07/15 223 lb 9.6 oz (101.4 kg)  08/17/14 205 lb 2 oz (93 kg)  BP (!) 147/108 (BP Location: Right Arm, Patient Position: Sitting, Cuff Size: Normal)   Pulse 62   Temp 97.8 F (36.6 C) (Oral)   Resp 18   Ht 5\' 10"  (1.778 m)   Wt 221 lb 6.4 oz (100.4 kg)   SpO2 100%   BMI 31.77 kg/m

## 2016-07-31 ENCOUNTER — Ambulatory Visit
Admission: RE | Admit: 2016-07-31 | Discharge: 2016-07-31 | Disposition: A | Discharge status: Home / Self Care | Payer: Medicaid Other | Source: Ambulatory Visit | Attending: Radiation Oncology | Admitting: Radiation Oncology

## 2016-07-31 ENCOUNTER — Encounter: Payer: Self-pay | Admitting: Radiation Oncology

## 2016-07-31 VITALS — BP 147/108 | HR 62 | Temp 97.8°F | Resp 18 | Ht 70.0 in | Wt 221.4 lb

## 2016-07-31 DIAGNOSIS — C6211 Malignant neoplasm of descended right testis: Secondary | ICD-10-CM

## 2016-07-31 DIAGNOSIS — C6291 Malignant neoplasm of right testis, unspecified whether descended or undescended: Secondary | ICD-10-CM | POA: Insufficient documentation

## 2016-07-31 DIAGNOSIS — I10 Essential (primary) hypertension: Secondary | ICD-10-CM | POA: Insufficient documentation

## 2016-07-31 DIAGNOSIS — E785 Hyperlipidemia, unspecified: Secondary | ICD-10-CM | POA: Diagnosis not present

## 2016-07-31 DIAGNOSIS — F99 Mental disorder, not otherwise specified: Secondary | ICD-10-CM | POA: Diagnosis not present

## 2016-07-31 DIAGNOSIS — K219 Gastro-esophageal reflux disease without esophagitis: Secondary | ICD-10-CM | POA: Diagnosis not present

## 2016-07-31 DIAGNOSIS — K589 Irritable bowel syndrome without diarrhea: Secondary | ICD-10-CM | POA: Diagnosis not present

## 2016-07-31 DIAGNOSIS — J309 Allergic rhinitis, unspecified: Secondary | ICD-10-CM | POA: Diagnosis not present

## 2016-07-31 DIAGNOSIS — F419 Anxiety disorder, unspecified: Secondary | ICD-10-CM | POA: Insufficient documentation

## 2016-07-31 DIAGNOSIS — F909 Attention-deficit hyperactivity disorder, unspecified type: Secondary | ICD-10-CM | POA: Insufficient documentation

## 2016-07-31 NOTE — Progress Notes (Addendum)
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 S/p paraaortic radiotherapy to 22.5 Gy in 15 fractions from 10/17/2008-11/09/2008   Narrative:  Mr. [name] [name] is here for an annual follow up visit for malignant neoplasm of the testis. He denies pain or discomfort in the scrotum and groin area. His mother asks that we do not use the term cancer for his care when discussing this in front of him.  Having normal bowel and bladder patterns with occasional diarrhea or constipation. Appetite is good. Having fatigue some days when he has been very active. Germ cell tumors were drawn last year and were negative for LDH, AFP, and bHCG.   On review of systems, the patient reports that he is doing well overall. He denies any chest pain, shortness of breath, cough, fevers, chills, night sweats, unintended weight changes. He denies any bowel or bladder disturbances, and denies abdominal pain, nausea or vomiting. He denies any new musculoskeletal or joint aches or pains. A complete review of systems is obtained and is otherwise negative.  Past Medical History:  Past Medical History:  Diagnosis Date  . Allergic rhinitis   . Anxiety   . 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 GERD (gastroesophageal reflux disease)   . H/O: hypertension   . Helicobacter pylori gastritis    hx of  . HLD (hyperlipidemia)   . IBS (irritable bowel syndrome)   . Mentally challenged   . Radiation 09/27/08-11/09/2008   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  . Testicular cancer [facility])    s/p orchiectomy  . Vomiting    self-induced    Past Surgical History: Past Surgical History:  Procedure Laterality Date    . COLONOSCOPY    . ESOPHAGOGASTRODUODENOSCOPY    . NASAL SEPTUM SURGERY  1996   x 2  . ORCHIECTOMY  09/18/2008   right inguinal     Social History:  Social History   Social History  . Marital status: Single    Spouse name: N/A  . Number of children: 0  . Years of education: N/A   Occupational History  . handicapped    Social History Main Topics  . Smoking status: Never Smoker  . Smokeless tobacco: Never Used  . Alcohol use No  . Drug use: No  . Sexual activity: Not on file   Other Topics Concern  . Not on file   Social History Narrative  . No narrative on file    Family History: Family History  Problem Relation Age of Onset  . Pancreatic cancer Maternal Grandfather   . Crohn's disease Maternal Grandmother   . Irritable bowel syndrome Mother   . Interstitial cystitis Maternal Aunt   . Diabetes Mother     steroids  . Heart disease Maternal Grandmother     great  . Heart disease Maternal Aunt                                  ALLERGIES:  has No Known Allergies.  Meds: Current Outpatient Prescriptions  Medication Sig Dispense Refill  . atorvastatin (LIPITOR)  10 MG tablet Take 10 mg by mouth daily.  0  . lansoprazole (PREVACID) 30 MG capsule Take 30 mg by mouth daily.    Marland Kitchen LORazepam (ATIVAN) 0.5 MG tablet Take 0.5 mg by mouth 2 (two) times daily.  0  . ranitidine (ZANTAC) 150 MG capsule Take 150 mg by mouth every evening.    . fluticasone (FLONASE) 50 MCG/ACT nasal spray Place 2 sprays into the nose daily.     No current facility-administered medications for this encounter.     Physical Findings:  height is 5\' 10"  (1.778 m) and weight is 221 lb 6.4 oz (100.4 kg). His oral temperature is 97.8 F (36.6 C). His blood pressure is 147/108 (abnormal) and his pulse is 62. His respiration is 18 and oxygen saturation is 100%.   In general this is a well appearing caucasian male in no acute distress. He is alert and oriented x4 and appropriate throughout the  examination. HEENT reveals that the patient is normocephalic, atraumatic. EOMs are intact. PERRLA. Skin is intact without any evidence of gross lesions. Cardiovascular exam reveals a regular rate and rhythm, no clicks rubs or murmurs are auscultated. Chest is clear to auscultation bilaterally. Lymphatic assessment is performed and does not reveal any adenopathy in the cervical, supraclavicular, axillary, or inguinal chains. Abdomen has active bowel sounds in all quadrants and is intact. The abdomen is soft, non tender, non distended. Lower extremities are negative for pretibial pitting edema, deep calf tenderness, cyanosis or clubbing. GU exam reveals normal appearing external male genitalia. No gross lesions are noted. The penis is circumcised and otherwise unremarkable. The scrotum contains left testicle which is generally large without distinct nodularity or tenderness. The right hemiscrotum is empty without palpable spermatic cord. There are no skin changes of the scrotum or groin.   Lab Findings: Lab Results  Component Value Date   WBC 8.0 10/07/2007   HGB 15.5 09/18/2008   HCT 41.1 10/07/2007   MCV 83.9 10/07/2007   PLT 204 10/07/2007  Results for HAIZEN, SAFER (MRN [id number])   Ref. Range 06/06/2015 14:39  LDH Latest Ref Range: 125-245 U/L 192  Results for KISHAN, DODDRIDGE (MRN [id number])   Ref. Range 06/06/2015 14:39  AFP Tumor Marker Latest Ref Range: <6.1 ng/mL 5.2  Results for EITO, OLSAVSKY (MRN [id number])   Ref. Range 08/30/2010 15:32  Beta hCG, Tumor Marker Latest Ref Range: < 5.0 mIU/mL 0.5  Results for YADER, POLMAN (MRN [id number])   Ref. Range 06/06/2015 14:39  HCG, Serum Unknown NEG   Impression/Plan:  1. 45 y.o. 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 The patient continues to be clinically without evidence of disease. We will follow up with the results of his lab work and inform his mother. We would recommend he return  in 12 months for his next follow up or sooner if he has questions or concerns about his previous treatment, or with new symptoms.  This document serves as a record of services personally performed by Tyler Pita, MD and Shona Simpson, PAC. It was created on his behalf by Arlyce Harman, a trained medical scribe. The creation of this record is based on the scribe's personal observations and the provider's statements to them. This document has been checked and approved by the attending provider.

## 2016-07-31 NOTE — Addendum Note (Signed)
Encounter addended by: Malena Edman, RN on: 07/31/2016  1:20 PM<BR>    Actions taken: Charge Capture section accepted

## 2016-08-01 ENCOUNTER — Telehealth: Payer: Self-pay | Admitting: *Deleted

## 2016-08-01 LAB — LACTATE DEHYDROGENASE, ISOENZYMES
(LD) FRACTION 1: 16 % — AB (ref 17–32)
(LD) FRACTION 3: 26 % (ref 17–27)
(LD) FRACTION 4: 13 % (ref 5–13)
(LD) FRACTION 5: 12 % (ref 4–20)
(LD) Fraction 2: 33 % (ref 25–40)
LDH: 167 IU/L (ref 121–224)

## 2016-08-01 LAB — ALPHA FETO PROTEIN (PARALLEL TESTING): AFP-Tumor Marker: 5.8 ng/mL (ref <6.1)

## 2016-08-01 LAB — BETA HCG QUANT (REF LAB): hCG Quant: <1 m[IU]/mL (ref 0–3)

## 2016-08-01 LAB — AFP TUMOR MARKER: AFP, SERUM, TUMOR MARKER: 5.2 ng/mL (ref 0.0–8.3)

## 2016-08-01 NOTE — Telephone Encounter (Signed)
Called Mr. Hirota mother, to inform her that the LDH level is also within normal limits and she expressed thanks

## 2016-08-01 NOTE — Telephone Encounter (Addendum)
Called and spoke with Macario Golds mother, Delores.  Relayed that his AFP and HCG labs have resulted, and are within the normal range.  Informed her that she will be called once the remaining labs are completed. She stated thanks.

## 2016-12-25 ENCOUNTER — Other Ambulatory Visit: Payer: Self-pay | Admitting: Family Medicine

## 2016-12-25 DIAGNOSIS — N509 Disorder of male genital organs, unspecified: Secondary | ICD-10-CM

## 2016-12-30 ENCOUNTER — Ambulatory Visit
Admission: RE | Admit: 2016-12-30 | Discharge: 2016-12-30 | Disposition: A | Discharge status: Home / Self Care | Payer: Medicaid Other | Source: Ambulatory Visit | Attending: Family Medicine | Admitting: Family Medicine

## 2016-12-30 DIAGNOSIS — N509 Disorder of male genital organs, unspecified: Secondary | ICD-10-CM

## 2017-07-30 ENCOUNTER — Ambulatory Visit: Payer: Self-pay | Admitting: Radiation Oncology

## 2017-07-31 NOTE — Progress Notes (Signed)
[name] [name] 46 y.o. 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  Pain:Denies pain Bowel/bladder pattern:Diarrhea for four days with urination a lot.  Mother said he did not have an elevated temperature during that time. Appetite:Fair has an eating disorder. Eats two meals a day with snacks. Fatigue:Reports feeling tired sometimes. Urologist: Dr. Gaynelle Arabian who has retired [employer] from Last 3 Encounters:  07/31/16 221 lb 6.4 oz (100.4 kg)  06/07/15 223 lb 9.6 oz (101.4 kg)  08/17/14 205 lb 2 oz (93 kg)  BP (!) 153/98   Pulse (!) 59   Temp 98.3 F (36.8 C) (Oral)

## 2017-08-07 ENCOUNTER — Encounter: Payer: Self-pay | Admitting: Urology

## 2017-08-07 ENCOUNTER — Ambulatory Visit
Admission: RE | Admit: 2017-08-07 | Discharge: 2017-08-07 | Disposition: A | Discharge status: Home / Self Care | Payer: Medicaid Other | Source: Ambulatory Visit | Attending: Urology | Admitting: Urology

## 2017-08-07 ENCOUNTER — Telehealth: Payer: Self-pay | Admitting: Urology

## 2017-08-07 VITALS — BP 153/98 | HR 59 | Temp 98.3°F

## 2017-08-07 DIAGNOSIS — R300 Dysuria: Secondary | ICD-10-CM

## 2017-08-07 DIAGNOSIS — C6291 Malignant neoplasm of right testis, unspecified whether descended or undescended: Secondary | ICD-10-CM

## 2017-08-07 DIAGNOSIS — C6211 Malignant neoplasm of descended right testis: Secondary | ICD-10-CM

## 2017-08-07 LAB — URINALYSIS, MICROSCOPIC - CHCC
Bilirubin (Urine): NEGATIVE
Blood: NEGATIVE
GLUCOSE UR CHCC: NEGATIVE mg/dL
Ketones: NEGATIVE mg/dL
LEUKOCYTE ESTERASE: NEGATIVE
NITRITE: NEGATIVE
Protein: NEGATIVE mg/dL
RBC / HPF: NEGATIVE (ref 0–2)
Specific Gravity, Urine: 1.005 (ref 1.003–1.035)
UROBILINOGEN UR: 0.2 mg/dL (ref 0.2–1)
WBC UA: NEGATIVE (ref 0–?)
pH: 6 (ref 4.6–8.0)

## 2017-08-07 LAB — LACTATE DEHYDROGENASE: LDH: 147 U/L (ref 125–245)

## 2017-08-07 NOTE — Progress Notes (Signed)
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 He denies pain or discomfort in the scrotum and groin area. His mother asks that we do not use the term cancer for his care when discussing this in front of him.  He reports having diarrhea for the past 2 weeks but this has improved significantly over the past 3 days.  His mother has also noticed that he is urinating more frequently than normal but has not had any issues with incontinence.  He reports some dysuria for the past 2 weeks and describes a burning sensation throughout his urine stream.  His normal bowel and bladder patterns involve increased frequency of urination and occasional diarrhea or constipation. He reports having a good appetite. He has fatigue on occasion when he has been very active. Germ cell tumor markers were drawn last year and were negative for LDH, AFP, and bHCG.   He sees his PCP, [name], annually for physical exam as well as prn for acute complaints.  His mother requests that we check his PSA today given the increased LUTS and h/o urologic cancer.  On review of systems, the patient reports that he is doing well overall. He denies any chest pain, shortness of breath, cough, fevers, chills, night sweats, unintended weight changes. He denies abdominal pain, nausea or vomiting. He denies any new musculoskeletal or joint aches or pains. A complete review of systems is obtained and is otherwise negative.  Past Medical  History:  Past Medical History:  Diagnosis Date  . Allergic rhinitis   . Anxiety   . 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 GERD (gastroesophageal reflux disease)   . H/O: hypertension   . Helicobacter pylori gastritis    hx of  . HLD (hyperlipidemia)   . IBS (irritable bowel syndrome)   . Mentally challenged   . Radiation 09/27/08-11/09/2008   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  . Testicular cancer [facility])    s/p orchiectomy  . Vomiting    self-induced    Past Surgical History: Past Surgical History:  Procedure Laterality Date  . COLONOSCOPY    . ESOPHAGOGASTRODUODENOSCOPY    . NASAL SEPTUM SURGERY  1996   x 2  . ORCHIECTOMY  09/18/2008   right inguinal     Social History:  Social History   Social History  . Marital status: Single    Spouse name: N/A  . Number of children: 0  . Years of education: N/A   Occupational History  . handicapped    Social History Main Topics  . Smoking status: Never Smoker  . Smokeless tobacco: Never Used  . Alcohol use No  . Drug use: No  . Sexual activity: Not on file   Other Topics Concern  . Not on file   Social History Narrative  . No narrative on file    Family History: Family History  Problem Relation Age of Onset  . Pancreatic cancer Maternal  Grandfather   . Crohn's disease Maternal Grandmother   . Heart disease Maternal Grandmother        great  . Irritable bowel syndrome Mother   . Diabetes Mother        steroids  . Interstitial cystitis Maternal Aunt   . Heart disease Maternal Aunt                                  ALLERGIES:  has No Known Allergies.  Meds: Current Outpatient Prescriptions  Medication Sig Dispense Refill  . fluticasone (FLONASE) 50 MCG/ACT nasal spray Place 2 sprays into the nose daily.    . lansoprazole (PREVACID) 30 MG capsule Take 30 mg by mouth daily.    Marland Kitchen LORazepam (ATIVAN) 0.5 MG tablet Take 0.5 mg by mouth 2 (two) times daily.  0  . ranitidine (ZANTAC) 150 MG  capsule Take 150 mg by mouth every evening.     No current facility-administered medications for this encounter.     Physical Findings:  oral temperature is 98.3 F (36.8 C). His blood pressure is 153/98 (abnormal) and his pulse is 59 (abnormal).   In general this is a well appearing caucasian male in no acute distress. He is alert and oriented x4 and appropriate throughout the examination. HEENT reveals that the patient is normocephalic, atraumatic. EOMs are intact. PERRLA. Skin is intact without any evidence of gross lesions. Cardiopulmonary assessment is negative for acute distress and he exhibits normal effort. Lymphatic assessment is performed and does not reveal any adenopathy in the inguinal chains. Abdomen has active bowel sounds in all quadrants and is intact. The abdomen is soft, non tender, non distended. Lower extremities are negative for pretibial pitting edema, deep calf tenderness, cyanosis or clubbing. GU exam reveals normal appearing external male genitalia. No gross lesions are noted. The penis is circumcised and otherwise unremarkable. The scrotum contains left testicle which is generally large without distinct nodularity or tenderness. The right hemiscrotum is empty without palpable spermatic cord. There are no skin changes of the scrotum or groin.   Lab Findings: Lab Results  Component Value Date   WBC 8.0 10/07/2007   HGB 15.5 09/18/2008   HCT 41.1 10/07/2007   MCV 83.9 10/07/2007   PLT 204 10/07/2007   Component     Latest Ref Rng & Units 07/31/2016  AFP Tumor Marker     <6.1 ng/mL 5.8  Lactate dehydrogenase, isoenzymes  Order: [id number]  Status:  Final result   Ref Range & Units 67yr ago 57yr ago   LDH 121 - 224 IU/L 167  192R    (LD) Fraction 1 17 - 32 % 16      (LD) Fraction 2 25 - 40 % 33     (LD) Fraction 3 17 - 27 % 26     (LD) Fraction 4 5 - 13 % 13     (LD) Fraction 5 4 - 20 % 12    Resulting Agency  [facility]      HCG, tumor marker    Status:  Final result   Ref Range & Units 53yr ago  hCG Quant 0 - 3 mIU/mL <1   Comment: Roche ECLIA methodology  Resulting Agency  [facility]       Impression/Plan:  1. 46 y.o. 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 The  patient continues to be clinically without evidence of disease. We will follow up with the results of his lab work and inform his mother. We will check his PSA and a urinalysis today given the increased LUTS.  We would recommend he return in 12 months for his next follow up or sooner if he has questions or concerns about his previous treatment, or with new symptoms.    Nicholos Johns, PA-C

## 2017-08-07 NOTE — Telephone Encounter (Signed)
I called and spoke with [name] [name]'s mom, [name], to inform her that his urinalysis was clear, without signs of infection. There was no spilling of glucose either. A formal urine culture has been sent but I do not anticipate that this will grow any bacteria. Therefore we will forego prescribing antibiotics at this time. I advised that I will be back in touch next week with the remainder of his lab results at that time. She was very appreciative of the call.   Nicholos Johns, PA-C

## 2017-08-10 ENCOUNTER — Telehealth: Payer: Self-pay | Admitting: *Deleted

## 2017-08-10 LAB — BETA HCG QUANT (REF LAB)

## 2017-08-10 LAB — AFP TUMOR MARKER: AFP, Serum, Tumor Marker: 5.5 ng/mL (ref 0.0–8.3)

## 2017-08-10 LAB — URINE CULTURE: ORGANISM ID, BACTERIA: NO GROWTH

## 2017-08-10 LAB — PSA: PROSTATE SPECIFIC AG, SERUM: 0.7 ng/mL (ref 0.0–4.0)

## 2017-08-10 NOTE — Telephone Encounter (Signed)
CALLED PATIENT TO INFORM OF 1 YR. FU ON 08/13/18 @ 1:30 PM WITH ASHLYN BRUNING, SPOKE WITH PATIENT'S MOTHER AND SHE IS AWARE OF THIS APPT.

## 2017-08-12 ENCOUNTER — Telehealth: Payer: Self-pay | Admitting: Radiation Oncology

## 2017-08-12 NOTE — Telephone Encounter (Signed)
Phoned patient's mother to review recent lab results. No answer on home or cell. I was able to leave a message on home phone only. Mailbox full on cell phone.

## 2017-08-14 ENCOUNTER — Telehealth: Payer: Self-pay | Admitting: Radiation Oncology

## 2017-08-14 NOTE — Telephone Encounter (Signed)
Phoned patient's mother's home and cell yesterday to review recent test result as requested by Ashlyn Bruning, PA-C. No answer. Left message requesting return call. Phoned patient's mother again today since yesterdays call hasn't been returned. Left message on patient's mother cell phone detailing the following: all blood work was normal, there is no evidence of disease recurrence (tumor markers are normal), PSA was 0.7, and urine culture was negative. Left my contact information again should the patient's mother have questions.

## 2018-08-13 ENCOUNTER — Ambulatory Visit: Payer: Self-pay | Admitting: Urology

## 2018-08-18 ENCOUNTER — Telehealth: Payer: Self-pay | Admitting: *Deleted

## 2018-08-18 NOTE — Telephone Encounter (Signed)
Called patient's mom to ask about moving appt. from 08-25-18 to 08-24-18, patient's mom declined and agreed to appt. On 09-03-18 @ 2:30 pm

## 2018-08-25 ENCOUNTER — Ambulatory Visit: Payer: Medicaid Other | Admitting: Urology

## 2018-08-25 NOTE — Progress Notes (Signed)
[name] [name] 47 y.o. 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  Pain:No  Mother reports he is having itching underneath the scrotum at times in the past week.  No issue with his groin areas. Bowel/bladder pattern:Will have diarrhea occasionally depending on how he eats; normally bowel movements are normal daily.  Urination normal pattern without any concerns today. Appetite:Good most of the time per mother. Fatigue:If he is very active he is fatigue. Urologist: Has not seen a urologist since Dr. Gaynelle Arabian retired.  Mother reports [name] has done some blood work. Mother has been monitoring his blood pressure he is off his blood pressure medication now but may be going back on it soon. Wt Readings from Last 3 Encounters:  09/03/18 228 lb 9.6 oz (103.7 kg)  07/31/16 221 lb 6.4 oz (100.4 kg)  06/07/15 223 lb 9.6 oz (101.4 kg)  BP (!) 140/103 (BP Location: Left Arm, Patient Position: Sitting)   Pulse 60   Temp 98.1 F (36.7 C) (Oral)   Resp 20   Ht 5\' 10"  (1.778 m)   Wt 228 lb 9.6 oz (103.7 kg)   SpO2 100%   BMI 32.80 kg/m

## 2018-09-03 ENCOUNTER — Ambulatory Visit
Admission: RE | Admit: 2018-09-03 | Discharge: 2018-09-03 | Disposition: A | Discharge status: Home / Self Care | Payer: Medicaid Other | Source: Ambulatory Visit | Attending: Urology | Admitting: Urology

## 2018-09-03 ENCOUNTER — Other Ambulatory Visit: Payer: Self-pay | Admitting: Urology

## 2018-09-03 ENCOUNTER — Other Ambulatory Visit: Payer: Self-pay

## 2018-09-03 ENCOUNTER — Encounter: Payer: Self-pay | Admitting: Urology

## 2018-09-03 VITALS — BP 140/103 | HR 60 | Temp 98.1°F | Resp 20 | Ht 70.0 in | Wt 228.6 lb

## 2018-09-03 DIAGNOSIS — C6291 Malignant neoplasm of right testis, unspecified whether descended or undescended: Secondary | ICD-10-CM

## 2018-09-03 DIAGNOSIS — Z79899 Other long term (current) drug therapy: Secondary | ICD-10-CM | POA: Diagnosis not present

## 2018-09-03 DIAGNOSIS — Z923 Personal history of irradiation: Secondary | ICD-10-CM | POA: Diagnosis not present

## 2018-09-03 LAB — LACTATE DEHYDROGENASE: LDH: 157 U/L (ref 98–192)

## 2018-09-03 NOTE — Progress Notes (Signed)
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 He denies pain or discomfort in the scrotum and groin area but his mother has noticed that he is scratching under his scrotum frequently over the past week. His mother asks that we do not use the term cancer for his care when discussing this in front of him.  He reports urinary frequency, urgency and hesitancy but has not had any issues with incontinence.  His mother reports that he drinks a heavy ammount of fluids throughout the day and right up to the time that he goes to bed so she does not feel that his nocturia is out of proportion to the fluids he is drinking in the evenings.  He denies dysuria or gross hematuria.  His normal bowel and bladder patterns involve increased frequency of urination and occasional diarrhea or constipation. He reports having a good appetite. He has fatigue on occasion when he has been very active. Germ cell tumor markers were drawn last year and were negative for LDH, AFP, and bHCG. PSA was 0.70 07/2017.  He sees his PCP, [name] [name], annually for physical exam as well as prn for acute complaints.  His mother requests that we provide a recommendation for urologist to establish care given the increased LUTS and h/o urologic cancer.  He has not been evaluated in urology since [name] [name] has retired.   On review of systems, the patient reports that he is doing well overall. He denies any chest pain, shortness of breath,  cough, fevers, chills, night sweats, unintended weight changes. He has intermittent itching beneath his scrotum but has not noted any rashes or skin lesions.  He denies abdominal pain, nausea or vomiting. He denies any new musculoskeletal or joint aches or pains. A complete review of systems is obtained and is otherwise negative.  Past Medical History:  Past Medical History:  Diagnosis Date  . Allergic rhinitis   . Anxiety   . 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 GERD (gastroesophageal reflux disease)   . H/O: hypertension   . Helicobacter pylori gastritis    hx of  . HLD (hyperlipidemia)   . IBS (irritable bowel syndrome)   . Mentally challenged   . Radiation 09/27/08-11/09/2008   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  . Testicular cancer [facility])    s/p orchiectomy  . Vomiting    self-induced    Past Surgical History: Past Surgical History:  Procedure Laterality Date  . COLONOSCOPY    . ESOPHAGOGASTRODUODENOSCOPY    . NASAL SEPTUM SURGERY  1996   x 2  . ORCHIECTOMY  09/18/2008   right inguinal     Social History:  Social History   Socioeconomic History  . Marital status: Single    Spouse name: Not on file  . Number of children: 0  . Years of education: Not on file  . Highest education level: Not on file  Occupational History  . Occupation:  handicapped  Social Needs  . Financial resource strain: Not on file  . Food insecurity:    Worry: Not on file    Inability: Not on file  . Transportation needs:    Medical: Not on file    Non-medical: Not on file  Tobacco Use  . Smoking status: Never Smoker  . Smokeless tobacco: Never Used  Substance and Sexual Activity  . Alcohol use: No  . Drug use: No  . Sexual activity: Not on file  Lifestyle  . Physical activity:    Days per week: Not on file    Minutes per session: Not on file  . Stress: Not on file  Relationships  . Social connections:    Talks on phone: Not on file    Gets together: Not on file     Attends religious service: Not on file    Active member of club or organization: Not on file    Attends meetings of clubs or organizations: Not on file    Relationship status: Not on file  . Intimate partner violence:    Fear of current or ex partner: Not on file    Emotionally abused: Not on file    Physically abused: Not on file    Forced sexual activity: Not on file  Other Topics Concern  . Not on file  Social History Narrative  . Not on file    Family History: Family History  Problem Relation Age of Onset  . Pancreatic cancer Maternal Grandfather   . Crohn's disease Maternal Grandmother   . Heart disease Maternal Grandmother        great  . Irritable bowel syndrome Mother   . Diabetes Mother        steroids  . Interstitial cystitis Maternal Aunt   . Heart disease Maternal Aunt                                  ALLERGIES:  has No Known Allergies.  Meds: Current Outpatient Medications  Medication Sig Dispense Refill  . fluticasone (FLONASE) 50 MCG/ACT nasal spray Place 2 sprays into the nose daily.    . lansoprazole (PREVACID) 30 MG capsule Take 30 mg by mouth daily.    Marland Kitchen LORazepam (ATIVAN) 0.5 MG tablet Take 0.5 mg by mouth 2 (two) times daily.  0  . ranitidine (ZANTAC) 150 MG capsule Take 150 mg by mouth every evening.     No current facility-administered medications for this encounter.     Physical Findings:  vitals were not taken for this visit.   In general this is a well appearing caucasian male in no acute distress. He is alert and oriented x4 and appropriate throughout the examination. HEENT reveals that the patient is normocephalic, atraumatic. EOMs are intact. PERRLA. Skin is intact without any evidence of gross lesions. Cardiopulmonary assessment is negative for acute distress and he exhibits normal effort. Lymphatic assessment is performed and does not reveal any adenopathy in the inguinal chains. Abdomen has active bowel sounds in all quadrants and is  intact. The abdomen is soft, non tender, non distended. Lower extremities are negative for pretibial pitting edema, deep calf tenderness, cyanosis or clubbing. GU exam reveals normal appearing external male genitalia. No gross lesions are noted. The penis is circumcised and otherwise unremarkable. The scrotum contains left testicle which is generally large without distinct nodularity or tenderness. The right hemiscrotum is empty without palpable spermatic  cord. There are no skin changes of the scrotum or groin.   Lab Findings: Lab Results  Component Value Date   WBC 8.0 10/07/2007   HGB 15.5 09/18/2008   HCT 41.1 10/07/2007   MCV 83.9 10/07/2007   PLT 204 10/07/2007   Component     Latest Ref Rng & Units 08/07/2017  LDH     125 - 245 U/L 147   Component     Latest Ref Rng & Units 08/07/2017  hCG Quant     0 - 3 mIU/mL <1   Component     Latest Ref Rng & Units 08/07/2017  AFP, Serum, Tumor Marker     0.0 - 8.3 ng/mL 5.5   Component     Latest Ref Rng & Units 08/07/2017  Prostate Specific Ag, Serum     0.0 - 4.0 ng/mL 0.7    Impression/Plan:  1. 47 y.o. 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  The patient continues to be clinically without evidence of disease. He will have labs today and we will follow up with the results of his lab work and inform his mother. For the scrotal itching, I have recommended trying OTC Gold Bond powder.  We would recommend he return in 12 months for his next follow up or sooner if he has questions or concerns about his previous treatment, or with new symptoms.    Nicholos Johns, PA-C

## 2018-09-04 LAB — BETA HCG QUANT (REF LAB): hCG Quant: <1 m[IU]/mL (ref 0–3)

## 2018-09-04 LAB — PROSTATE-SPECIFIC AG, SERUM (LABCORP): PROSTATE SPECIFIC AG, SERUM: 0.7 ng/mL (ref 0.0–4.0)

## 2018-09-04 LAB — AFP TUMOR MARKER: AFP, SERUM, TUMOR MARKER: 5 ng/mL (ref 0.0–8.3)

## 2018-09-07 ENCOUNTER — Telehealth: Payer: Self-pay | Admitting: Radiation Oncology

## 2018-09-07 NOTE — Telephone Encounter (Signed)
Phoned patient's mother/legal guardian to review recent lab results. No answer. Left message with my contact information requesting a return call.

## 2018-09-09 ENCOUNTER — Telehealth: Payer: Self-pay | Admitting: Radiation Oncology

## 2018-09-09 NOTE — Progress Notes (Signed)
Attempted to reach patient's mother again this time via mobile phone. Successfully explained to her that her son's recent labs, tumor markers and psa, are all within normal limits. Explained we will plan to see her son in a year unless they need [place] before. She verbalized understanding of all reviewed.

## 2019-07-07 ENCOUNTER — Other Ambulatory Visit: Payer: Self-pay

## 2019-07-07 ENCOUNTER — Other Ambulatory Visit: Payer: Self-pay | Admitting: Family Medicine

## 2019-07-07 ENCOUNTER — Ambulatory Visit
Admission: RE | Admit: 2019-07-07 | Discharge: 2019-07-07 | Disposition: A | Discharge status: Home / Self Care | Payer: Medicaid Other | Source: Ambulatory Visit | Attending: Family Medicine | Admitting: Family Medicine

## 2019-07-07 DIAGNOSIS — M79671 Pain in right foot: Secondary | ICD-10-CM

## 2019-08-25 ENCOUNTER — Telehealth: Payer: Self-pay | Admitting: *Deleted

## 2019-08-25 ENCOUNTER — Other Ambulatory Visit: Payer: Self-pay | Admitting: Urology

## 2019-08-25 ENCOUNTER — Ambulatory Visit
Admission: RE | Admit: 2019-08-25 | Discharge: 2019-08-25 | Disposition: A | Discharge status: Home / Self Care | Payer: Medicaid Other | Source: Ambulatory Visit | Attending: Urology | Admitting: Urology

## 2019-08-25 DIAGNOSIS — C6291 Malignant neoplasm of right testis, unspecified whether descended or undescended: Secondary | ICD-10-CM

## 2019-08-25 NOTE — Telephone Encounter (Signed)
Called patient's mom to inform that the visit today will be a  telephone visit, I also ask about patient coming in for labs, lvm for a return call

## 2019-09-16 ENCOUNTER — Ambulatory Visit: Payer: Medicaid Other

## 2019-09-16 ENCOUNTER — Ambulatory Visit
Admission: RE | Admit: 2019-09-16 | Discharge: 2019-09-16 | Disposition: A | Discharge status: Home / Self Care | Payer: Medicaid Other | Source: Ambulatory Visit | Attending: Urology | Admitting: Urology

## 2019-09-16 ENCOUNTER — Other Ambulatory Visit: Payer: Self-pay

## 2019-09-16 DIAGNOSIS — C6211 Malignant neoplasm of descended right testis: Secondary | ICD-10-CM | POA: Insufficient documentation

## 2019-09-16 DIAGNOSIS — C6291 Malignant neoplasm of right testis, unspecified whether descended or undescended: Secondary | ICD-10-CM

## 2019-09-17 LAB — BETA HCG QUANT (REF LAB): hCG Quant: <1 m[IU]/mL (ref 0–3)

## 2019-09-17 LAB — PROSTATE-SPECIFIC AG, SERUM (LABCORP): Prostate Specific Ag, Serum: 0.7 ng/mL (ref 0.0–4.0)

## 2019-09-17 LAB — AFP TUMOR MARKER: AFP, Serum, Tumor Marker: 4.9 ng/mL (ref 0.0–8.3)

## 2019-09-19 LAB — LACTATE DEHYDROGENASE: LDH: 154 U/L (ref 98–192)

## 2019-09-20 ENCOUNTER — Telehealth: Payer: Self-pay | Admitting: Radiation Oncology

## 2019-09-20 NOTE — Telephone Encounter (Signed)
Phoned patient's mother, Britt Boozer, at home. No answer and no option to leave a message. Phoned her cell. No answer. Left message explaining her son's recent lab results are normal. Provided my direct contact number for future questions.

## 2019-12-22 ENCOUNTER — Telehealth: Payer: Self-pay

## 2019-12-23 ENCOUNTER — Ambulatory Visit: Payer: Medicaid Other | Admitting: Urology

## 2020-01-04 ENCOUNTER — Telehealth: Payer: Self-pay | Admitting: *Deleted

## 2020-01-04 NOTE — Telephone Encounter (Signed)
RETURNED PATIENT'S MOM'S PHONE CALL, SPOKE [place]

## 2020-01-05 NOTE — Telephone Encounter (Signed)
Appointment (In regards to appointment 12/23/19 mom wanted an in office appointment offered in office appointment she declinded appointment due to travel plans changed to January 19, 2020)

## 2020-01-05 NOTE — Telephone Encounter (Signed)
Appointment (in regards to appointment 12/23/19 mom wanted an in office appointment

## 2020-01-19 ENCOUNTER — Ambulatory Visit: Payer: Self-pay | Admitting: Urology

## 2020-01-30 ENCOUNTER — Telehealth: Payer: Self-pay

## 2020-01-30 NOTE — Telephone Encounter (Signed)
Called mother so touch basis with her about son up coming appointment and and to remind her of appointment time unable to reach her left voicemail for her to call back to confirm appointment / telephone encounter

## 2020-02-01 ENCOUNTER — Ambulatory Visit: Payer: Medicaid Other | Admitting: Urology

## 2020-02-03 ENCOUNTER — Ambulatory Visit
Admission: RE | Admit: 2020-02-03 | Discharge: 2020-02-03 | Disposition: A | Discharge status: Home / Self Care | Payer: Medicaid Other | Source: Ambulatory Visit | Attending: Urology | Admitting: Urology

## 2020-02-03 ENCOUNTER — Other Ambulatory Visit: Payer: Self-pay

## 2020-02-03 ENCOUNTER — Encounter: Payer: Self-pay | Admitting: Urology

## 2020-02-03 VITALS — BP 139/100 | HR 72 | Temp 98.9°F | Resp 20 | Ht 73.0 in | Wt 238.6 lb

## 2020-02-03 DIAGNOSIS — C6291 Malignant neoplasm of right testis, unspecified whether descended or undescended: Secondary | ICD-10-CM

## 2020-02-03 NOTE — Progress Notes (Signed)
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 He denies pain or discomfort in the scrotum and groin area. His mother asks that we do not use the term cancer for his care when discussing this in front of him.  He reports unchanged urinary frequency, urgency and hesitancy but has not had any issues with incontinence.  His mother reports that he drinks a heavy ammount of fluids throughout the day and right up to the time that he goes to bed so she does not feel that his nocturia is out of proportion to the fluids he is drinking in the evenings.  He denies dysuria or gross hematuria.  His normal bowel and bladder patterns involve increased frequency of urination and occasional diarrhea or constipation. He reports having a good appetite. He has fatigue on occasion when he has been very active. Germ cell tumor markers were drawn 09/16/2019 and were negative for LDH, AFP, and bHCG. PSA was 0.70 09/16/2019.  He sees his PCP, [name], annually for physical exam as well as prn for acute complaints.  His mother requests that we provide a recommendation for urologist to establish care since he has not been evaluated in urology since Dr. Gaynelle Arabian has retired.   On review of systems, the patient reports that he is doing well overall. He denies any chest pain, shortness of breath, cough, fevers, chills, night sweats, unintended weight changes. He denies abdominal pain, nausea or vomiting. He has not had any recent changes in  his bladder or bowel function and denies any LUTS. He denies any new musculoskeletal or joint aches or pains. A complete review of systems is obtained and is otherwise negative.  Past Medical History:  Past Medical History:  Diagnosis Date  . Allergic rhinitis   . Anxiety   . 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 GERD (gastroesophageal reflux disease)   . H/O: hypertension   . Helicobacter pylori gastritis    hx of  . HLD (hyperlipidemia)   . IBS (irritable bowel syndrome)   . Mentally challenged   . Radiation 09/27/08-11/09/2008   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  . Testicular cancer [facility])    s/p orchiectomy  . Vomiting    self-induced    Past Surgical History: Past Surgical History:  Procedure Laterality Date  . COLONOSCOPY    . ESOPHAGOGASTRODUODENOSCOPY    . NASAL SEPTUM SURGERY  1996   x 2  . ORCHIECTOMY  09/18/2008   right inguinal     Social History:  Social History   Socioeconomic History  . Marital status: Single    Spouse name: Not on file  . Number of children: 0  . Years of education: Not on file  . Highest education level: Not on file  Occupational History  . Occupation: handicapped  Tobacco Use  . Smoking status: Never Smoker  . Smokeless tobacco: Never Used  Substance and Sexual Activity  . Alcohol use: No  .  Drug use: No  . Sexual activity: Not on file  Other Topics Concern  . Not on file  Social History Narrative  . Not on file   Social Determinants of Health   Financial Resource Strain:   . Difficulty of Paying Living Expenses:   Food Insecurity:   . Worried About Charity fundraiser in the Last Year:   . Arboriculturist in the Last Year:   Transportation Needs:   . Film/video editor (Medical):   Marland Kitchen Lack of Transportation (Non-Medical):   Physical Activity:   . Days of Exercise per Week:   . Minutes of Exercise per Session:   Stress:   . Feeling of Stress :   Social Connections:   . Frequency of  Communication with Friends and Family:   . Frequency of Social Gatherings with Friends and Family:   . Attends Religious Services:   . Active Member of Clubs or Organizations:   . Attends Archivist Meetings:   Marland Kitchen Marital Status:   Intimate Partner Violence:   . Fear of Current or Ex-Partner:   . Emotionally Abused:   Marland Kitchen Physically Abused:   . Sexually Abused:     Family History: Family History  Problem Relation Age of Onset  . Pancreatic cancer Maternal Grandfather   . Crohn's disease Maternal Grandmother   . Heart disease Maternal Grandmother        great  . Irritable bowel syndrome Mother   . Diabetes Mother        steroids  . Interstitial cystitis Maternal Aunt   . Heart disease Maternal Aunt                                  ALLERGIES:  has No Known Allergies.  Meds: Current Outpatient Medications  Medication Sig Dispense Refill  . fluticasone (FLONASE) 50 MCG/ACT nasal spray Place 2 sprays into the nose daily.    . lansoprazole (PREVACID) 30 MG capsule Take 30 mg by mouth daily.    Marland Kitchen LORazepam (ATIVAN) 0.5 MG tablet Take 0.5 mg by mouth 2 (two) times daily.  0  . ranitidine (ZANTAC) 150 MG capsule Take 150 mg by mouth every evening.     No current facility-administered medications for this encounter.    Physical Findings:  height is 6\' 1"  (1.854 m) and weight is 238 lb 9.6 oz (108.2 kg). His temperature is 98.9 F (37.2 C). His blood pressure is 139/100 (abnormal) and his pulse is 72. His respiration is 20 and oxygen saturation is 100%.   In general this is a well appearing caucasian male in no acute distress. He is alert and oriented x4 and appropriate throughout the examination. HEENT reveals that the patient is normocephalic, atraumatic. EOMs are intact. PERRLA. Skin is intact without any evidence of gross lesions. Cardiopulmonary assessment is negative for acute distress and he exhibits normal effort. Lymphatic assessment is performed and does not reveal  any adenopathy in the inguinal chains. Abdomen has active bowel sounds in all quadrants and is intact. The abdomen is soft, non tender, non distended. Lower extremities are negative for pretibial pitting edema, deep calf tenderness, cyanosis or clubbing. GU exam reveals normal appearing external male genitalia. No gross lesions are noted. The penis is circumcised and otherwise unremarkable. The scrotum contains left testicle which is generally large without distinct nodularity or tenderness. The right hemiscrotum is empty without palpable spermatic  cord. There are no skin changes of the scrotum or groin.   Lab Findings: Lab Results  Component Value Date   WBC 8.0 10/07/2007   HGB 15.5 09/18/2008   HCT 41.1 10/07/2007   MCV 83.9 10/07/2007   PLT 204 10/07/2007         Impression/Plan:  1. 49 y.o. 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  The patient continues to be clinically without evidence of disease and his recent labs from 08/2019 remain stable.  We would recommend he return in 12 months for his next follow up or sooner if he has questions or concerns about his previous treatment, or with new symptoms.    Nicholos Johns, PA-C

## 2020-06-21 IMAGING — CR DG FOOT COMPLETE 3+V*R*
3 series · 3 of 3 positions shown · non-contrast
Comparison: None.

CLINICAL DATA: Right foot pain, rule out fracture at base of right
fifth metatarsal

EXAM:
RIGHT FOOT COMPLETE - 3+ VIEW

[x foot ap right]
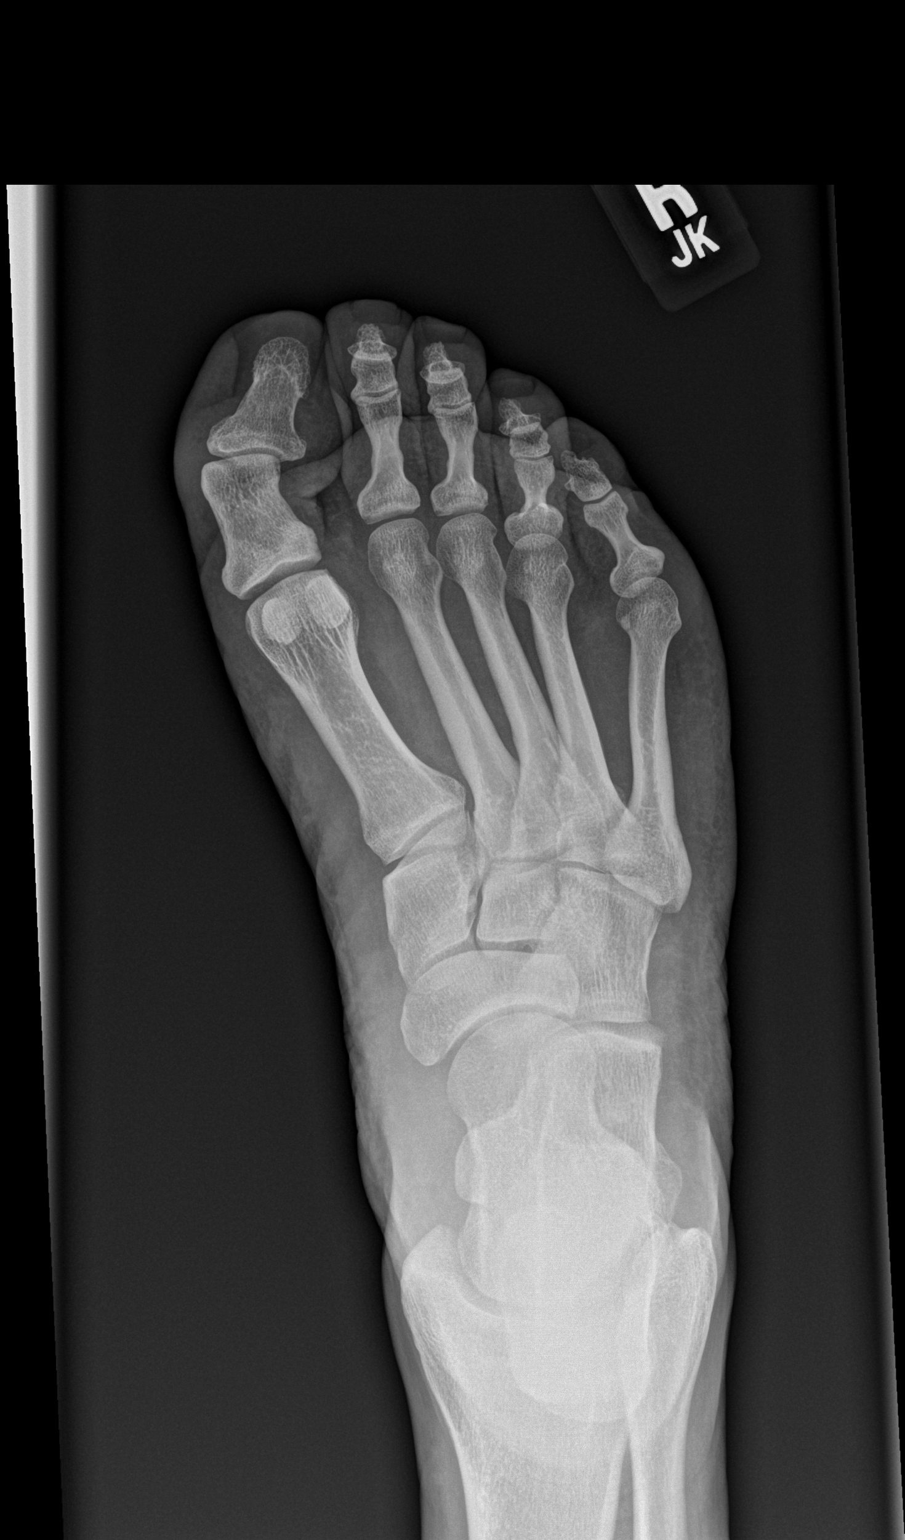

[x foot obl right]
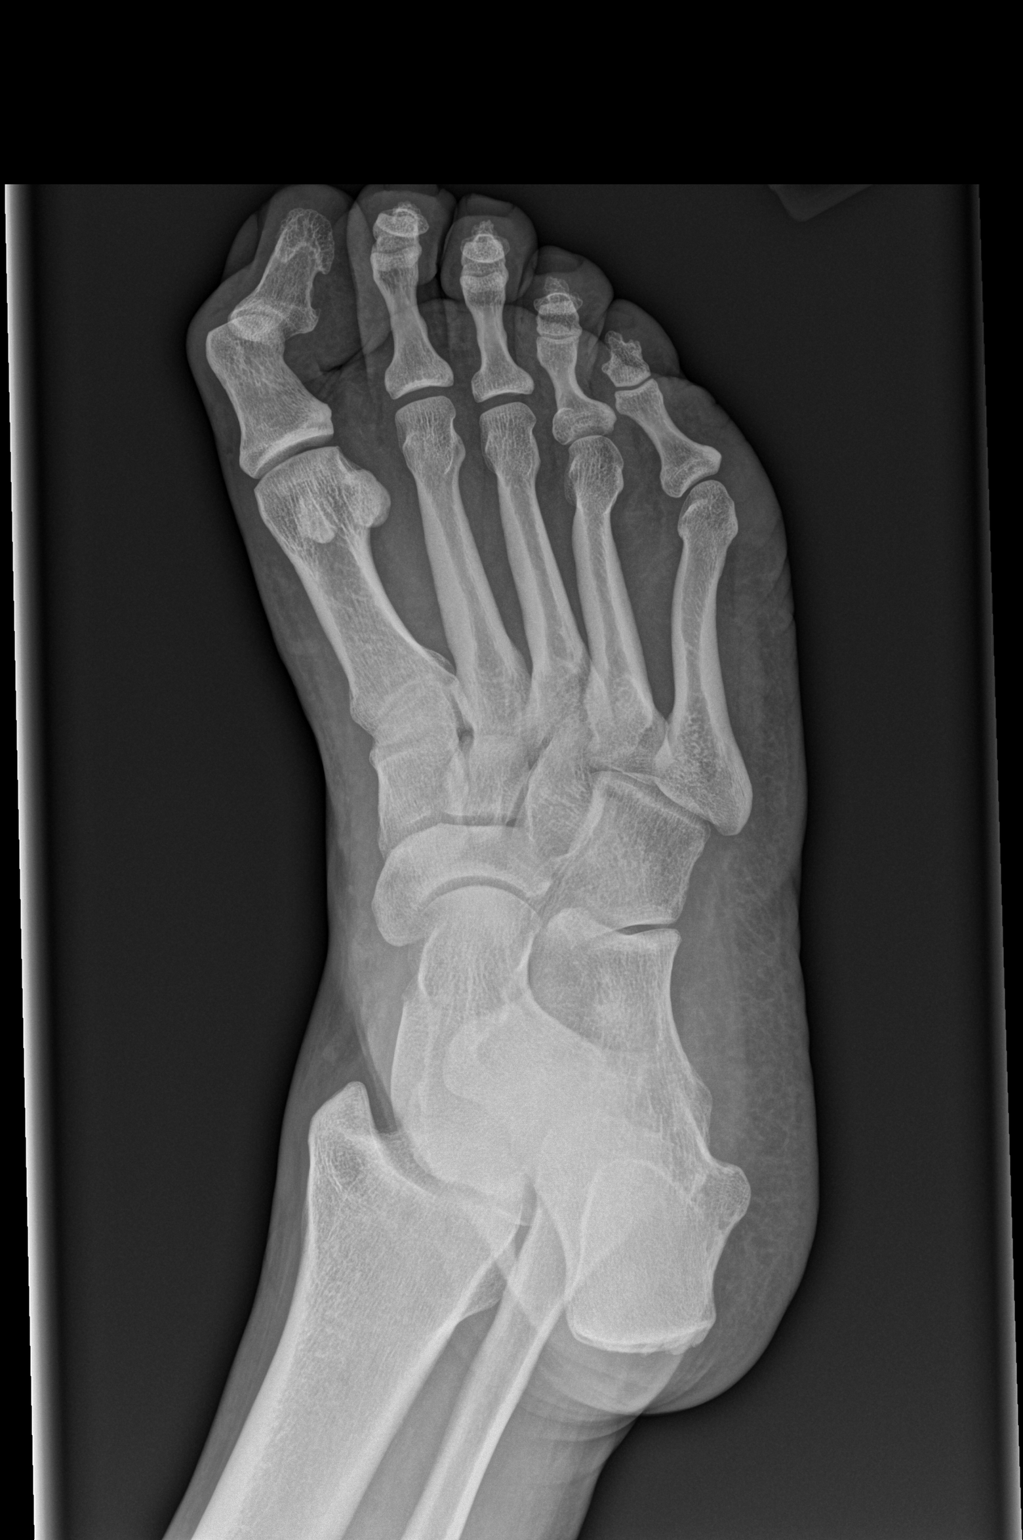

[x foot lat right]
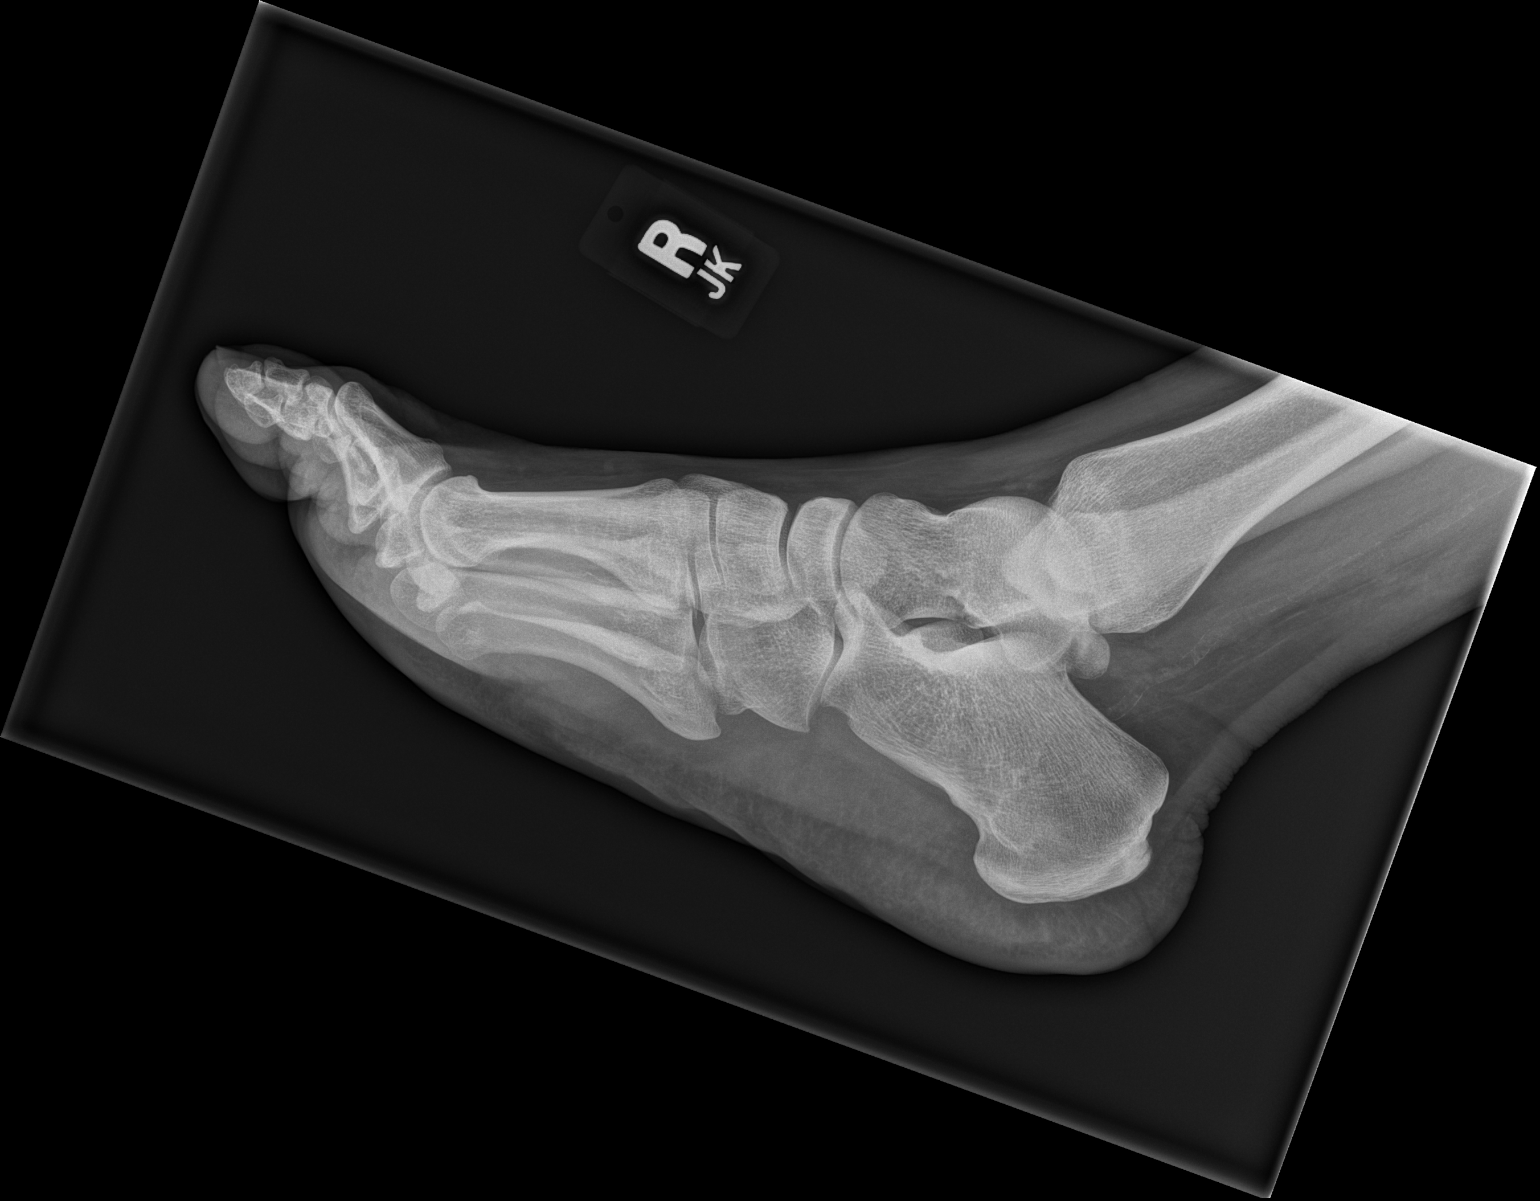

[3 of 3 positions shown; findings below may reference images not displayed]

FINDINGS: No fracture or dislocation of right foot, with particular attention
to the base of the fifth metatarsal. There is a hammertoe deformity
of the right great toe. Joint spaces are well preserved. Vascular
calcinosis about the ankle.
IMPRESSION: No fracture or dislocation of right foot, with particular attention
to the base of the fifth metatarsal. There is a hammertoe deformity
of the right great toe.

## 2021-01-25 ENCOUNTER — Ambulatory Visit: Payer: 59

## 2021-01-25 DIAGNOSIS — Z8547 Personal history of malignant neoplasm of testis: Secondary | ICD-10-CM | POA: Insufficient documentation

## 2021-01-25 DIAGNOSIS — K589 Irritable bowel syndrome without diarrhea: Secondary | ICD-10-CM | POA: Insufficient documentation

## 2021-01-25 DIAGNOSIS — Z8719 Personal history of other diseases of the digestive system: Secondary | ICD-10-CM | POA: Insufficient documentation

## 2021-01-25 DIAGNOSIS — R5383 Other fatigue: Secondary | ICD-10-CM | POA: Insufficient documentation

## 2021-01-25 DIAGNOSIS — E785 Hyperlipidemia, unspecified: Secondary | ICD-10-CM | POA: Insufficient documentation

## 2021-01-25 DIAGNOSIS — Z800 Family history of malignant neoplasm of digestive organs: Secondary | ICD-10-CM | POA: Insufficient documentation

## 2021-01-25 DIAGNOSIS — Z923 Personal history of irradiation: Secondary | ICD-10-CM | POA: Insufficient documentation

## 2021-01-25 DIAGNOSIS — Z79899 Other long term (current) drug therapy: Secondary | ICD-10-CM | POA: Insufficient documentation

## 2021-01-25 DIAGNOSIS — K219 Gastro-esophageal reflux disease without esophagitis: Secondary | ICD-10-CM | POA: Insufficient documentation

## 2021-01-25 DIAGNOSIS — R350 Frequency of micturition: Secondary | ICD-10-CM | POA: Insufficient documentation

## 2021-01-30 ENCOUNTER — Telehealth: Payer: Self-pay | Admitting: *Deleted

## 2021-01-30 NOTE — Telephone Encounter (Signed)
CALLED PATIENT 'S MOTHER - LORI Cromartie TO ASK ABOUT WHAT SHE WANTS TO DO ABOUT [name] LABS, LVM FOR A RETURN CALL

## 2021-01-30 NOTE — Telephone Encounter (Signed)
xxxx 

## 2021-01-31 ENCOUNTER — Ambulatory Visit: Admission: RE | Admit: 2021-01-31 | Payer: 59 | Source: Ambulatory Visit | Admitting: Urology

## 2021-02-01 ENCOUNTER — Other Ambulatory Visit: Payer: Self-pay

## 2021-02-01 ENCOUNTER — Ambulatory Visit
Admission: RE | Admit: 2021-02-01 | Discharge: 2021-02-01 | Disposition: A | Discharge status: Home / Self Care | Payer: 59 | Source: Ambulatory Visit | Attending: Urology | Admitting: Urology

## 2021-02-01 DIAGNOSIS — R5383 Other fatigue: Secondary | ICD-10-CM | POA: Diagnosis not present

## 2021-02-01 DIAGNOSIS — R350 Frequency of micturition: Secondary | ICD-10-CM | POA: Diagnosis not present

## 2021-02-01 DIAGNOSIS — K589 Irritable bowel syndrome without diarrhea: Secondary | ICD-10-CM | POA: Diagnosis not present

## 2021-02-01 DIAGNOSIS — C6291 Malignant neoplasm of right testis, unspecified whether descended or undescended: Secondary | ICD-10-CM

## 2021-02-01 DIAGNOSIS — Z800 Family history of malignant neoplasm of digestive organs: Secondary | ICD-10-CM | POA: Diagnosis not present

## 2021-02-01 DIAGNOSIS — Z8719 Personal history of other diseases of the digestive system: Secondary | ICD-10-CM | POA: Diagnosis not present

## 2021-02-01 DIAGNOSIS — Z8547 Personal history of malignant neoplasm of testis: Secondary | ICD-10-CM | POA: Diagnosis present

## 2021-02-01 DIAGNOSIS — E785 Hyperlipidemia, unspecified: Secondary | ICD-10-CM | POA: Diagnosis not present

## 2021-02-01 DIAGNOSIS — K219 Gastro-esophageal reflux disease without esophagitis: Secondary | ICD-10-CM | POA: Diagnosis not present

## 2021-02-01 DIAGNOSIS — Z923 Personal history of irradiation: Secondary | ICD-10-CM | POA: Diagnosis not present

## 2021-02-01 DIAGNOSIS — Z79899 Other long term (current) drug therapy: Secondary | ICD-10-CM | POA: Diagnosis not present

## 2021-02-01 LAB — LACTATE DEHYDROGENASE: LDH: 181 U/L (ref 98–192)

## 2021-02-02 LAB — PROSTATE-SPECIFIC AG, SERUM (LABCORP): Prostate Specific Ag, Serum: 0.6 ng/mL (ref 0.0–4.0)

## 2021-02-02 LAB — AFP TUMOR MARKER: AFP, Serum, Tumor Marker: 5.2 ng/mL (ref 0.0–6.9)

## 2021-02-02 LAB — BETA HCG QUANT (REF LAB): hCG Quant: <1 m[IU]/mL (ref 0–3)

## 2021-02-07 ENCOUNTER — Telehealth: Payer: Self-pay | Admitting: *Deleted

## 2021-02-07 NOTE — Telephone Encounter (Signed)
XXXX 

## 2021-02-07 NOTE — Telephone Encounter (Signed)
Returned patient's mom's phone call, spoke with patient's mom

## 2021-02-13 ENCOUNTER — Telehealth: Payer: Self-pay | Admitting: *Deleted

## 2021-02-13 NOTE — Telephone Encounter (Signed)
CALLED PATIENT'S MOM- LORI Quiett TO INFORM THAT [name] [name] WILL BE GLAD TO SEE HER SON ON 02-22-21 @ 10:30 AM  OR 1:00 PM, PATIENT'S MOM- AGREED TO 02-22-21 @ 10:30 AM

## 2021-02-15 ENCOUNTER — Ambulatory Visit: Payer: 59 | Admitting: Urology

## 2021-02-22 ENCOUNTER — Encounter: Payer: Self-pay | Admitting: Urology

## 2021-02-22 ENCOUNTER — Ambulatory Visit
Admission: RE | Admit: 2021-02-22 | Discharge: 2021-02-22 | Disposition: A | Discharge status: Home / Self Care | Payer: 59 | Source: Ambulatory Visit | Attending: Urology | Admitting: Urology

## 2021-02-22 ENCOUNTER — Ambulatory Visit: Admission: RE | Admit: 2021-02-22 | Payer: 59 | Source: Ambulatory Visit | Admitting: Urology

## 2021-02-22 ENCOUNTER — Other Ambulatory Visit: Payer: Self-pay

## 2021-02-22 DIAGNOSIS — Z8547 Personal history of malignant neoplasm of testis: Secondary | ICD-10-CM | POA: Diagnosis not present

## 2021-02-22 DIAGNOSIS — C6291 Malignant neoplasm of right testis, unspecified whether descended or undescended: Secondary | ICD-10-CM

## 2021-02-22 NOTE — Progress Notes (Signed)
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 He denies pain or discomfort in the scrotum and groin area. His mother asks that we do not use the term cancer for his care when discussing this in front of him.  He reports unchanged urinary frequency, urgency and hesitancy but has not had any issues with incontinence.  His mother reports that he drinks a heavy ammount of fluids throughout the day and right up to the time that he goes to bed so she does not feel that his nocturia is out of proportion to the fluids he is drinking in the evenings.  He denies dysuria or gross hematuria.  His normal bowel and bladder patterns involve increased frequency of urination and occasional diarrhea or constipation, unchanged recently. He reports having a good appetite. He has fatigue on occasion when he has been very active. Germ cell tumor markers were drawn 02/01/2021 and were negative for LDH, AFP, and bHCG. PSA was 0.60 02/01/2021.  He sees his PCP, [name], annually for physical exam as well as prn for acute complaints.  We have provided a recommendation for a local urologist to establish care since he has not been evaluated in urology since Dr. Gaynelle Arabian has retired. They have not established care with urology to date.  On review of systems, the patient reports that he is doing well overall. He denies any chest pain, shortness of breath, cough, fevers, chills, night sweats, unintended weight changes. He denies abdominal pain,  nausea or vomiting. He has not had any recent changes in his bladder or bowel function and denies any LUTS. He denies any new musculoskeletal or joint aches or pains. A complete review of systems is obtained and is otherwise negative.  Past Medical History:  Past Medical History:  Diagnosis Date  . Allergic rhinitis   . Anxiety   . 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 GERD (gastroesophageal reflux disease)   . H/O: hypertension   . Helicobacter pylori gastritis    hx of  . HLD (hyperlipidemia)   . IBS (irritable bowel syndrome)   . Mentally challenged   . Radiation 09/27/08-11/09/2008   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  . Testicular cancer [facility])    s/p orchiectomy  . Vomiting    self-induced    Past Surgical History: Past Surgical History:  Procedure Laterality Date  . COLONOSCOPY    . ESOPHAGOGASTRODUODENOSCOPY    . NASAL SEPTUM SURGERY  1996   x 2  . ORCHIECTOMY  09/18/2008   right inguinal     Social History:  Social History   Socioeconomic History  . Marital status: Single    Spouse name: Not on file  . Number of children: 0  . Years of education: Not on file  . Highest education level: Not on file  Occupational History  . Occupation: handicapped  Tobacco Use  . Smoking status: Never Smoker  . Smokeless tobacco: Never Used  Substance  and Sexual Activity  . Alcohol use: No  . Drug use: No  . Sexual activity: Not on file  Other Topics Concern  . Not on file  Social History Narrative  . Not on file   Social Determinants of Health   Financial Resource Strain: Not on file  Food Insecurity: Not on file  Transportation Needs: Not on file  Physical Activity: Not on file  Stress: Not on file  Social Connections: Not on file  Intimate Partner Violence: Not on file    Family History: Family History  Problem Relation Age of Onset  . Pancreatic cancer Maternal Grandfather   . Crohn's disease Maternal Grandmother   . Heart disease  Maternal Grandmother        great  . Irritable bowel syndrome Mother   . Diabetes Mother        steroids  . Interstitial cystitis Maternal Aunt   . Heart disease Maternal Aunt                                  ALLERGIES:  has No Known Allergies.  Meds: Current Outpatient Medications  Medication Sig Dispense Refill  . cetirizine (ZYRTEC) 10 MG chewable tablet 1 tablet    . Chlorphen-Pseudoephed-APAP (TYLENOL ALLERGY COMPLETE PO)     . famotidine (PEPCID) 40 MG tablet Take 40 mg by mouth daily.    . fluticasone (FLONASE) 50 MCG/ACT nasal spray Place 2 sprays into the nose daily.    . lansoprazole (PREVACID) 30 MG capsule Take 30 mg by mouth daily.    Marland Kitchen LORazepam (ATIVAN) 0.5 MG tablet Take 0.5 mg by mouth 2 (two) times daily.  0  . olmesartan (BENICAR) 40 MG tablet Take 40 mg by mouth daily.    Marland Kitchen omeprazole (PRILOSEC) 20 MG capsule Take 1 capsule by mouth 2 (two) times daily.    . propranolol (INDERAL) 40 MG tablet 1 tablet    . ranitidine (ZANTAC) 150 MG capsule Take 150 mg by mouth every evening.    . valACYclovir (VALTREX) 1000 MG tablet Take by mouth.     No current facility-administered medications for this encounter.    Physical Findings:  height is 5' 11.3" (1.811 m) and weight is 247 lb 6.4 oz (112.2 kg). His temperature is 97.6 F (36.4 C). His blood pressure is 130/95 (abnormal) and his pulse is 60. His respiration is 20 and oxygen saturation is 100%.   In general this is a well appearing caucasian male in no acute distress. He is alert and oriented x4 and appropriate throughout the examination. HEENT reveals that the patient is normocephalic, atraumatic. EOMs are intact. PERRLA. Skin is intact without any evidence of gross lesions. Cardiopulmonary assessment is negative for acute distress and he exhibits normal effort. Lymphatic assessment is performed and does not reveal any adenopathy in the inguinal chains. Abdomen has active bowel sounds in all quadrants and is intact.  The abdomen is soft, non tender, non distended. Lower extremities are negative for pretibial pitting edema, deep calf tenderness, cyanosis or clubbing. GU exam reveals normal appearing external male genitalia. No gross lesions are noted. The penis is circumcised and otherwise unremarkable. The scrotum contains left testicle which is generally large without distinct nodularity or tenderness. The right hemiscrotum is empty without palpable spermatic cord. There are no skin changes of the scrotum or groin.   Lab Findings: Lab Results  Component Value Date   WBC  8.0 10/07/2007   HGB 15.5 09/18/2008   HCT 41.1 10/07/2007   MCV 83.9 10/07/2007   PLT 204 10/07/2007     Impression/Plan:  1. 50 y.o. 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  The patient continues to be clinically without evidence of disease and his recent labs from 02/01/2021 remain stable.  We would recommend he return in 12 months for his next follow up or sooner if he has questions or concerns about his previous treatment, or with new symptoms.    Nicholos Johns, PA-C

## 2021-02-22 NOTE — Progress Notes (Signed)
He is currently in no pain.  Patient complains of None. Reports urinary frequency and hematuria Urgency and nocturia once nightly.    Patient states they urinate 0 - 1 times per night.   Patient reports Diarrhea  2, 2  bowel movement(s) per day.   Patient reports good appetite, sleeping well. Denies pain, fatigue, weight loss, nausea, vomiting, abdominal/scrotal/ groin pain. Denies dysuria or incomplete bladder emptying, Denies rectal bleeding or constipation. Report good ambulation and movement. Denies skin irritation.    Vitals:   02/22/21 1108  BP: (!) 130/95  Pulse: 60  Resp: 20  Temp: 97.6 F (36.4 C)  SpO2: 100%  Weight: 247 lb 6.4 oz (112.2 kg)  Height: 5' 11.3" (1.811 m)

## 2022-05-14 ENCOUNTER — Telehealth: Payer: Self-pay | Admitting: *Deleted

## 2022-05-14 NOTE — Telephone Encounter (Signed)
RETURNED PATIENT'S MOM'S PHONE CALL, LVM FOR A RETURN CALL

## 2022-07-23 ENCOUNTER — Telehealth: Payer: Self-pay

## 2022-07-23 NOTE — Telephone Encounter (Signed)
Voicemail left in reference to completing the nursing questions for patient's 2:00pm-07/24/22 telephone appointment w/ Ashlyn Bruning PA-C. I left my extension [phone] and requested that patient return my call prior to appt.

## 2022-07-24 ENCOUNTER — Encounter: Payer: Self-pay | Admitting: Urology

## 2022-07-24 ENCOUNTER — Other Ambulatory Visit: Payer: Self-pay | Admitting: Urology

## 2022-07-24 ENCOUNTER — Ambulatory Visit
Admission: RE | Admit: 2022-07-24 | Discharge: 2022-07-24 | Disposition: A | Discharge status: Home / Self Care | Payer: 59 | Source: Ambulatory Visit | Attending: Urology | Admitting: Urology

## 2022-07-24 VITALS — BP 126/88 | HR 66 | Temp 97.9°F | Resp 18 | Ht 71.0 in | Wt 261.5 lb

## 2022-07-24 DIAGNOSIS — Z923 Personal history of irradiation: Secondary | ICD-10-CM | POA: Diagnosis not present

## 2022-07-24 DIAGNOSIS — I10 Essential (primary) hypertension: Secondary | ICD-10-CM | POA: Insufficient documentation

## 2022-07-24 DIAGNOSIS — Z8547 Personal history of malignant neoplasm of testis: Secondary | ICD-10-CM | POA: Diagnosis not present

## 2022-07-24 DIAGNOSIS — Z800 Family history of malignant neoplasm of digestive organs: Secondary | ICD-10-CM | POA: Insufficient documentation

## 2022-07-24 DIAGNOSIS — Z79899 Other long term (current) drug therapy: Secondary | ICD-10-CM | POA: Diagnosis not present

## 2022-07-24 DIAGNOSIS — K219 Gastro-esophageal reflux disease without esophagitis: Secondary | ICD-10-CM | POA: Insufficient documentation

## 2022-07-24 DIAGNOSIS — C6211 Malignant neoplasm of descended right testis: Secondary | ICD-10-CM

## 2022-07-24 DIAGNOSIS — E785 Hyperlipidemia, unspecified: Secondary | ICD-10-CM | POA: Insufficient documentation

## 2022-07-24 DIAGNOSIS — R351 Nocturia: Secondary | ICD-10-CM | POA: Diagnosis not present

## 2022-07-24 LAB — LACTATE DEHYDROGENASE: LDH: 140 U/L (ref 98–192)

## 2022-07-24 NOTE — Progress Notes (Signed)
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 He denies pain or discomfort in the scrotum and groin area. His mother asks that we do not use the term cancer for his care when discussing this in front of him.  He reports unchanged urinary frequency, urgency and hesitancy but has not had any issues with incontinence.  His mother reports that he drinks a heavy ammount of fluids throughout the day and right up to the time that he goes to bed so she does not feel that his nocturia is out of proportion to the fluids he is drinking in the evenings.  He denies dysuria or gross hematuria.  His normal bowel and bladder patterns involve increased frequency of urination and occasional diarrhea or constipation, unchanged recently. He reports having a good appetite. He has fatigue on occasion when he has been very active. Germ cell tumor markers were drawn 02/01/2021 and were negative for LDH, AFP, and bHCG. PSA was 0.60 02/01/2021.  He sees his PCP, [name], annually for physical exam as well as prn for acute complaints but [name] is retiring this year.  We have provided a recommendation for a local urologist to establish care since he has not been evaluated in urology since Dr. Gaynelle Arabian has retired but they have not established care with urology to date.  On review of systems, the patient reports that he is doing well overall. He denies any chest pain, shortness of breath, cough, fevers, chills, night sweats, or unintended  weight loss.  He has gained some weight over the past year, mainly due to less activity due to several programs closing during the COVID pandemic and not re-opening. He denies abdominal pain, nausea or vomiting. He has not had any recent changes in his bladder or bowel function and denies any LUTS. He denies any testicular/scrotal pain or new musculoskeletal or joint aches or pains. A complete review of systems is obtained and is otherwise negative.  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    GERD (gastroesophageal reflux disease)    H/O: hypertension    Helicobacter pylori gastritis    hx of   HLD (hyperlipidemia)    IBS (irritable bowel syndrome)    Mentally challenged    Radiation 09/27/08-11/09/2008   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   Testicular cancer [facility])    s/p orchiectomy   Vomiting    self-induced    Past Surgical History: Past Surgical History:  Procedure Laterality Date   COLONOSCOPY     ESOPHAGOGASTRODUODENOSCOPY     NASAL SEPTUM SURGERY  1996   x 2   ORCHIECTOMY  09/18/2008   right inguinal     Social History:  Social History   Socioeconomic History   Marital status: Single    Spouse name: Not on file   Number of children: 0  Years of education: Not on file   Highest education level: Not on file  Occupational History   Occupation: handicapped  Tobacco Use   Smoking status: Never   Smokeless tobacco: Never  Substance and Sexual Activity   Alcohol use: No   Drug use: No   Sexual activity: Not on file  Other Topics Concern   Not on file  Social History Narrative   Not on file   Social Determinants of Health   Financial Resource Strain: Not on file  Food Insecurity: Not on file  Transportation Needs: Not on file  Physical Activity: Not on file  Stress: Not on file  Social Connections: Not on file  Intimate Partner Violence: Not At Risk  (09/03/2018)   Humiliation, Afraid, Rape, and Kick questionnaire    Fear of Current or Ex-Partner: No    Emotionally Abused: No    Physically Abused: No    Sexually Abused: No    Family History: Family History  Problem Relation Age of Onset   Pancreatic cancer Maternal Grandfather    Crohn's disease Maternal Grandmother    Heart disease Maternal Grandmother        great   Irritable bowel syndrome Mother    Diabetes Mother        steroids   Interstitial cystitis Maternal Aunt    Heart disease Maternal Aunt                                  ALLERGIES:  has No Known Allergies.  Meds: Current Outpatient Medications  Medication Sig Dispense Refill   cetirizine (ZYRTEC) 10 MG chewable tablet 1 tablet     Chlorphen-Pseudoephed-APAP (TYLENOL ALLERGY COMPLETE PO)      famotidine (PEPCID) 40 MG tablet Take 40 mg by mouth daily.     fluticasone (FLONASE) 50 MCG/ACT nasal spray Place 2 sprays into the nose daily.     lansoprazole (PREVACID) 30 MG capsule Take 30 mg by mouth daily.     LORazepam (ATIVAN) 0.5 MG tablet Take 0.5 mg by mouth 2 (two) times daily.  0   olmesartan (BENICAR) 40 MG tablet Take 40 mg by mouth daily.     omeprazole (PRILOSEC) 20 MG capsule Take 1 capsule by mouth 2 (two) times daily.     propranolol (INDERAL) 40 MG tablet 1 tablet     ranitidine (ZANTAC) 150 MG capsule Take 150 mg by mouth every evening.     valACYclovir (VALTREX) 1000 MG tablet Take by mouth.     No current facility-administered medications for this encounter.    Physical Findings:  height is '5\' 11"'$  (1.803 m) and weight is 261 lb 8 oz (118.6 kg). His oral temperature is 97.9 F (36.6 C). His blood pressure is 126/88 and his pulse is 66. His respiration is 18 and oxygen saturation is 100%.   In general this is a well appearing caucasian male in no acute distress. He is alert and oriented x4 and appropriate throughout the examination. HEENT reveals that the patient is normocephalic, atraumatic.  EOMs are intact. PERRLA. Skin is intact without any evidence of gross lesions. Cardiopulmonary assessment is negative for acute distress and he exhibits normal effort. Lymphatic assessment is performed and does not reveal any adenopathy in the inguinal chains. Abdomen has active bowel sounds in all quadrants and is intact. The abdomen is soft, non tender, non distended. Lower extremities are negative for pretibial pitting edema,  deep calf tenderness, cyanosis or clubbing. GU exam reveals normal appearing external male genitalia. No gross lesions are noted. The penis is circumcised and otherwise unremarkable. The scrotum contains left testicle which is generally large without distinct nodularity or tenderness. The right hemiscrotum is empty without palpable spermatic cord. There are no skin changes of the scrotum or groin.   Lab Findings: Lab Results  Component Value Date   WBC 8.0 10/07/2007   HGB 15.5 09/18/2008   HCT 41.1 10/07/2007   MCV 83.9 10/07/2007   PLT 204 10/07/2007     Impression/Plan:  1. 51 y.o. 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  The patient continues to be clinically stable without evidence of disease and his labs have remained stable. We will repeat tumor markers and a PSA today and I will call his mom with the results as soon as they are available.  Since he does not have a urologist, we would recommend he return in 12 months for his next follow up or sooner if he has questions or concerns about his previous treatment, or with any new symptoms.    Nicholos Johns, PA-C

## 2022-07-24 NOTE — Progress Notes (Signed)
Follow-up appointment. I verified patient's identity and began nursing interview w/ patient's mother Ms. Delores Govoni in attendance. Patient is doing well. No discomfort reported at this time.  Meaningful use complete.  BP 126/88 (BP Location: Right Arm, Patient Position: Sitting, Cuff Size: Large)   Pulse 66   Temp 97.9 F (36.6 C) (Oral)   Resp 18   Ht '5\' 11"'$  (1.803 m)   Wt 261 lb 8 oz (118.6 kg)   SpO2 100%   BMI 36.47 kg/m

## 2022-07-25 ENCOUNTER — Telehealth: Payer: Self-pay | Admitting: *Deleted

## 2022-07-25 NOTE — Telephone Encounter (Signed)
CALLED PATIENT'S MOM- [name] [name] TO INFORM OF 1 YR. FU ON 07-23-23 @ 2 PM, SPOKE WITH PATIENT'S MOM- [name] AND SHE IS AWARE OF THIS APPT.

## 2022-07-26 LAB — BETA HCG QUANT (REF LAB): hCG Quant: <1 m[IU]/mL (ref 0–3)

## 2022-07-26 LAB — PROSTATE-SPECIFIC AG, SERUM (LABCORP): Prostate Specific Ag, Serum: 0.8 ng/mL (ref 0.0–4.0)

## 2022-07-26 LAB — AFP TUMOR MARKER: AFP, Serum, Tumor Marker: 4.4 ng/mL (ref 0.0–6.9)

## 2022-07-31 ENCOUNTER — Telehealth: Payer: Self-pay

## 2022-07-31 NOTE — Telephone Encounter (Signed)
RN called patient mother Rodger Giangregorio about test results had to leave message to call back if additional information is needed.

## 2022-11-24 ENCOUNTER — Encounter: Payer: Self-pay | Admitting: Internal Medicine

## 2023-02-04 ENCOUNTER — Encounter: Payer: Self-pay | Admitting: Internal Medicine

## 2023-02-04 NOTE — Telephone Encounter (Signed)
This encounter was created in error - please disregard.

## 2023-02-10 ENCOUNTER — Ambulatory Visit (INDEPENDENT_AMBULATORY_CARE_PROVIDER_SITE_OTHER): Payer: 59 | Admitting: Internal Medicine

## 2023-02-10 ENCOUNTER — Encounter: Payer: Self-pay | Admitting: Internal Medicine

## 2023-02-10 VITALS — BP 128/86 | HR 70 | Ht 71.0 in | Wt 275.0 lb

## 2023-02-10 DIAGNOSIS — F819 Developmental disorder of scholastic skills, unspecified: Secondary | ICD-10-CM

## 2023-02-10 DIAGNOSIS — Z1211 Encounter for screening for malignant neoplasm of colon: Secondary | ICD-10-CM

## 2023-02-10 NOTE — Progress Notes (Unsigned)
   [name] [name] 51 y.o. 30-Oct-1970 [id number]  Assessment & Plan:   Encounter Diagnoses  Name Primary?   Colon cancer screening Yes   Mental developmental delay     clenpiq   Subjective:   Chief Complaint:  HPI  No Known Allergies Current Meds  Medication Sig   cetirizine (ZYRTEC) 10 MG chewable tablet 1 tablet   Chlorphen-Pseudoephed-APAP (TYLENOL ALLERGY COMPLETE PO)    famotidine (PEPCID) 40 MG tablet Take 40 mg by mouth daily.   fluticasone (FLONASE) 50 MCG/ACT nasal spray Place 2 sprays into the nose daily.   lansoprazole (PREVACID) 30 MG capsule Take 30 mg by mouth daily.   LORazepam (ATIVAN) 0.5 MG tablet Take 0.5 mg by mouth 2 (two) times daily.   olmesartan (BENICAR) 40 MG tablet Take 40 mg by mouth daily.   omeprazole (PRILOSEC) 20 MG capsule Take 1 capsule by mouth 2 (two) times daily.   propranolol (INDERAL) 40 MG tablet 1 tablet   valACYclovir (VALTREX) 1000 MG tablet Take by mouth.   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    GERD (gastroesophageal reflux disease)    H/O: hypertension    Helicobacter pylori gastritis    hx of   HLD (hyperlipidemia)    IBS (irritable bowel syndrome)    Mentally challenged    Radiation 09/27/08-11/09/2008   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   Testicular cancer    s/p orchiectomy   Vomiting    self-induced   Past Surgical History:  Procedure Laterality Date   COLONOSCOPY     ESOPHAGOGASTRODUODENOSCOPY     NASAL SEPTUM SURGERY  1996   x 2   ORCHIECTOMY  09/18/2008   right inguinal    Social History   Social History Narrative   Not on file   family history includes Crohn's disease in his maternal grandmother; Diabetes in his mother; Heart disease in his maternal aunt and maternal grandmother; Interstitial cystitis in his maternal aunt; Irritable bowel syndrome in his mother; Pancreatic cancer in his maternal  grandfather.   Review of Systems   Objective:   Physical Exam  128/86   Pulse 70   Ht  (1.803 m)   Wt 275 lb (124.7 kg)   BMI 38.35 kg/m @  General:  NAD Eyes:   anicteric Lungs:  clear Heart::  S1S2 no rubs, murmurs or gallops Abdomen:  soft and nontender, BS+ Ext:   no edema, cyanosis or clubbing    Data Reviewed:

## 2023-02-10 NOTE — Patient Instructions (Signed)
_______________________________________________________  If your blood pressure at your visit was 140/90 or greater, please contact your primary care physician to follow up on this.  _______________________________________________________  If you are age 50 or older, your body mass index should be between 23-30. Your Body mass index is 38.35 kg/m. If this is out of the aforementioned range listed, please consider follow up with your Primary Care Provider.  If you are age 64 or younger, your body mass index should be between 19-25. Your Body mass index is 38.35 kg/m. If this is out of the aformentioned range listed, please consider follow up with your Primary Care Provider.   ________________________________________________________  The [facility] providers would like to encourage you to use [facility] to communicate with providers for non-urgent requests or questions.  Due to long hold times on the telephone, sending your provider a message by [facility] may be a faster and more efficient way to get a response.  Please allow 48 business hours for a response.  Please remember that this is for non-urgent requests.  _______________________________________________________   [name] [name] have been scheduled for a colonoscopy. Please follow written instructions given to you at your visit today.  Please pick up your prep supplies at the pharmacy within the next 1-3 days. If you use inhalers (even only as needed), please bring them with you on the day of your procedure.  Medication Samples have been provided to the patient.  Drug name: Clenpiq               Qty: 1  LOT: [id number]  Exp.Date: 02-24-2024   It was a pleasure to see you today!  Thank you for trusting me with your gastrointestinal care!

## 2023-02-11 ENCOUNTER — Encounter: Payer: Self-pay | Admitting: Internal Medicine

## 2023-03-12 ENCOUNTER — Encounter: Payer: Self-pay | Admitting: Internal Medicine

## 2023-03-12 ENCOUNTER — Ambulatory Visit (AMBULATORY_SURGERY_CENTER): Payer: 59 | Admitting: Internal Medicine

## 2023-03-12 VITALS — BP 126/90 | HR 64 | Temp 98.4°F | Resp 14

## 2023-03-12 DIAGNOSIS — Z1211 Encounter for screening for malignant neoplasm of colon: Secondary | ICD-10-CM

## 2023-03-12 DIAGNOSIS — D123 Benign neoplasm of transverse colon: Secondary | ICD-10-CM | POA: Diagnosis not present

## 2023-03-12 DIAGNOSIS — D125 Benign neoplasm of sigmoid colon: Secondary | ICD-10-CM

## 2023-03-12 DIAGNOSIS — D120 Benign neoplasm of cecum: Secondary | ICD-10-CM

## 2023-03-12 MED ORDER — SODIUM CHLORIDE 0.9 % IV SOLN: 500.0000 mL | Freq: Once | INTRAVENOUS | Status: DC

## 2023-03-12 NOTE — Op Note (Signed)
[facility] Patient Name: [name] [name] Procedure Date: 03/12/2023 11:05 AM MRN: [id number] Endoscopist: Iva Boop , MD, [id number] Age: 52 Referring MD:  Date of Birth: 02-13-1971 Gender: Male Account #: [id number] Procedure:                Colonoscopy Indications:              Screening for colorectal malignant neoplasm, Last                            colonoscopy: 2008 Medicines:                Monitored Anesthesia Care Procedure:                Pre-Anesthesia Assessment:                           - Prior to the procedure, a History and Physical                            was performed, and patient medications and                            allergies were reviewed. The patient's tolerance of                            previous anesthesia was also reviewed. The risks                            and benefits of the procedure and the sedation                            options and risks were discussed with the patient.                            All questions were answered, and informed consent                            was obtained. Prior Anticoagulants: The patient has                            taken no anticoagulant or antiplatelet agents. ASA                            Grade Assessment: II - A patient with mild systemic                            disease. After reviewing the risks and benefits,                            the patient was deemed in satisfactory condition to                            undergo the procedure.  After obtaining informed consent, the colonoscope                            was passed under direct vision. Throughout the                            procedure, the patient's blood pressure, pulse, and                            oxygen saturations were monitored continuously. The                            Olympus CF-HQ190L SN [id number] was introduced through                            the anus and advanced to the the cecum,  identified                            by appendiceal orifice and ileocecal valve. The                            colonoscopy was performed without difficulty. The                            patient tolerated the procedure well. The quality                            of the bowel preparation was good. The bowel                            preparation used was Clenpiq via split dose                            instruction. The ileocecal valve, appendiceal                            orifice, and rectum were photographed. Scope In: 11:21:44 AM Scope Out: 11:41:54 AM Scope Withdrawal Time: 0 hours 17 minutes 47 seconds  Total Procedure Duration: 0 hours 20 minutes 10 seconds  Findings:                 The perianal and digital rectal examinations were                            normal. Pertinent negatives include normal prostate                            (size, shape, and consistency).                           A 15 to 20 mm polyp was found in the cecum. The                            polyp was multi-lobulated and sessile. The polyp  was removed with a piecemeal technique using a cold                            snare. Resection and retrieval were complete.                            Verification of patient identification for the                            specimen was done. Estimated blood loss was minimal.                           Six sessile polyps were found in the sigmoid colon                            and transverse colon. The polyps were 3 to 8 mm in                            size. These polyps were removed with a cold snare.                            Resection and retrieval were complete. Verification                            of patient identification for the specimen was                            done. Estimated blood loss was minimal.                           Internal hemorrhoids were found.                           The exam was otherwise without  abnormality on                            direct and retroflexion views. Complications:            No immediate complications. Estimated Blood Loss:     Estimated blood loss was minimal. Impression:               - One 15 to 20 mm polyp in the cecum, removed                            piecemeal using a cold snare. Resected and                            retrieved.                           - Six 3 to 8 mm polyps in the sigmoid colon and in                            the transverse colon, removed with  a cold snare.                            Resected and retrieved.                           - The examination was otherwise normal on direct                            and retroflexion views. Recommendation:           - Patient has a contact number available for                            emergencies. The signs and symptoms of potential                            delayed complications were discussed with the                            patient. Return to normal activities tomorrow.                            Written discharge instructions were provided to the                            patient.                           - Resume previous diet.                           - Continue present medications.                           - Await pathology results.                           - Repeat colonoscopy is recommended for                            surveillance. The colonoscopy date will be                            determined after pathology results from today's                            exam become available for review. Iva Boop, MD 03/12/2023 11:50:37 AM This report has been signed electronically.

## 2023-03-12 NOTE — Progress Notes (Signed)
History and Physical Interval Note:  03/12/2023 11:08 AM  [name] [name]  has presented today for endoscopic procedure(s), with the diagnosis of  Encounter Diagnosis  Name Primary?   Colon cancer screening Yes  .  The various methods of evaluation and treatment have been discussed with the patient and/or family. After consideration of risks, benefits and other options for treatment, the patient has consented to  the endoscopic procedure(s).   The patient's history has been reviewed, patient examined, no change in status, stable for endoscopic procedure(s).  I have reviewed the patient's chart and labs.  Questions were answered to the patient's satisfaction.     Iva Boop, MD, [facility]

## 2023-03-12 NOTE — Progress Notes (Signed)
Called to room to assist during endoscopic procedure.  Patient ID and intended procedure confirmed with present staff. Received instructions for my participation in the procedure from the performing physician.  

## 2023-03-12 NOTE — Progress Notes (Signed)
Pt resting comfortably. VSS. Airway intact. SBAR complete to RN. All questions answered.   

## 2023-03-12 NOTE — Patient Instructions (Addendum)
I found and removed 7 polyps - all look benign.  I will let you know pathology results and when to have another routine colonoscopy by mail and/or My Chart.  I appreciate the opportunity to care for you. Iva Boop, MD, [facility]  Await pathology results.  Handouts on polyps and hemorrhoids provided.  YOU HAD AN ENDOSCOPIC PROCEDURE TODAY AT THE [facility]:   Refer to the procedure report that was given to you for any specific questions about what was found during the examination.  If the procedure report does not answer your questions, please call your gastroenterologist to clarify.  If you requested that your care partner not be given the details of your procedure findings, then the procedure report has been included in a sealed envelope for you to review at your convenience later.  YOU SHOULD EXPECT: Some feelings of bloating in the abdomen. Passage of more gas than usual.  Walking can help get rid of the air that was put into your GI tract during the procedure and reduce the bloating. If you had a lower endoscopy (such as a colonoscopy or flexible sigmoidoscopy) you may notice spotting of blood in your stool or on the toilet paper. If you underwent a bowel prep for your procedure, you may not have a normal bowel movement for a few days.  Please Note:  You might notice some irritation and congestion in your nose or some drainage.  This is from the oxygen used during your procedure.  There is no need for concern and it should clear up in a day or so.  SYMPTOMS TO REPORT IMMEDIATELY:  Following lower endoscopy (colonoscopy or flexible sigmoidoscopy):  Excessive amounts of blood in the stool  Significant tenderness or worsening of abdominal pains  Swelling of the abdomen that is new, acute  Fever of 100F or higher   For urgent or emergent issues, a gastroenterologist can be reached at any hour by calling [phone]. Do not use MyChart messaging for urgent concerns.     DIET:  We do recommend a small meal at first, but then you may proceed to your regular diet.  Drink plenty of fluids but you should avoid alcoholic beverages for 24 hours.  ACTIVITY:  You should plan to take it easy for the rest of today and you should NOT DRIVE or use heavy machinery until tomorrow (because of the sedation medicines used during the test).    FOLLOW UP: Our staff will call the number listed on your records the next business day following your procedure.  We will call around 7:15- 8:00 am to check on you and address any questions or concerns that you may have regarding the information given to you following your procedure. If we do not reach you, we will leave a message.     If any biopsies were taken you will be contacted by phone or by letter within the next 1-3 weeks.  Please call us at [phone] if you have not heard about the biopsies in 3 weeks.    SIGNATURES/CONFIDENTIALITY: You and/or your care partner have signed paperwork which will be entered into your electronic medical record.  These signatures attest to the fact that that the information above on your After Visit Summary has been reviewed and is understood.  Full responsibility of the confidentiality of this discharge information lies with you and/or your care-partner.

## 2023-03-12 NOTE — Progress Notes (Signed)
Pt's states no medical or surgical changes since previsit or office visit. 

## 2023-03-13 ENCOUNTER — Telehealth: Payer: Self-pay

## 2023-03-13 NOTE — Telephone Encounter (Signed)
Follow up call placed, VM obtained and message left on patient's mother's VM

## 2023-03-20 ENCOUNTER — Encounter: Payer: Self-pay | Admitting: Internal Medicine

## 2023-03-20 DIAGNOSIS — Z860101 Personal history of adenomatous and serrated colon polyps: Secondary | ICD-10-CM

## 2023-03-20 DIAGNOSIS — Z86010 Personal history of colonic polyps: Secondary | ICD-10-CM

## 2023-03-20 HISTORY — DX: Personal history of adenomatous and serrated colon polyps: Z86.0101

## 2023-03-26 ENCOUNTER — Telehealth: Payer: Self-pay | Admitting: Internal Medicine

## 2023-03-26 NOTE — Telephone Encounter (Signed)
Inbound call from patient mother inquiring about lab results done on 03/11/2023.Please advise

## 2023-03-26 NOTE — Telephone Encounter (Signed)
Spoke with patient's mother & advised her of the results and recommendations that were included in the letter sent to them by mail recently and that she should be receiving that copy soon. No further questions.

## 2023-07-23 ENCOUNTER — Other Ambulatory Visit: Payer: Self-pay | Admitting: Urology

## 2023-07-23 ENCOUNTER — Ambulatory Visit: Payer: 59 | Admitting: Urology

## 2023-07-23 DIAGNOSIS — C6211 Malignant neoplasm of descended right testis: Secondary | ICD-10-CM

## 2023-07-29 NOTE — Progress Notes (Signed)
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 He denies pain or discomfort in the scrotum and groin area. His mother asks that we do not use the term cancer for his care when discussing this in front of him.  He reports unchanged urinary frequency, urgency and hesitancy but has not had any issues with incontinence.  His mother reports that he drinks a heavy ammount of fluids throughout the day and right up to the time that he goes to bed so she does not feel that his nocturia is out of proportion to the fluids he is drinking in the evenings.  He denies dysuria or gross hematuria.  His normal bowel and bladder patterns involve increased frequency of urination and occasional diarrhea or constipation, unchanged recently. He reports having a good appetite. He has fatigue on occasion when he has been very active. His labs from 07/24/22 were all normal. Germ cell tumor markers were drawn today and results are pending.   He sees his PCP, [name] [name], annually for physical exam as well as prn for acute complaints but [name] [name] is retiring this year.  We have provided a recommendation for a local urologist to establish care since he has not been evaluated in urology since [name] [name] has retired but they have not established care with urology to date.   On review of systems, the patient reports that he is doing well overall. He denies any chest pain, shortness of breath, cough, fevers, chills, night sweats, or unintended  weight loss.  He reports a healthy appetite and has continued to gain some weight, mainly due to less activity due to several programs closing during the COVID pandemic and not re-opening. He denies abdominal pain, nausea or vomiting. He has not had any recent changes in his bladder or bowel function and denies any LUTS. He denies any testicular/scrotal pain or new musculoskeletal or joint aches or pains. A complete review of systems is obtained and is otherwise negative.  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    GERD (gastroesophageal reflux disease)    H/O: hypertension    Helicobacter pylori gastritis    hx of   HLD (hyperlipidemia)    Hx of adenomatous colonic polyps 03/20/2023   03/12/2023 8 adenomas max 15-20 mm recall 2027   IBS (irritable bowel syndrome)    Mentally challenged    Radiation 09/27/08-11/09/2008   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   Testicular cancer [facility])    s/p orchiectomy   Vomiting    self-induced    Past Surgical History: Past Surgical History:  Procedure Laterality Date   COLONOSCOPY     ESOPHAGOGASTRODUODENOSCOPY     NASAL SEPTUM SURGERY  1996   x 2   ORCHIECTOMY  09/18/2008   right inguinal     Social History:  Social History  Socioeconomic History   Marital status: Single    Spouse name: Not on file   Number of children: 0   Years of education: Not on file   Highest education level: Not on file  Occupational History   Occupation: handicapped  Tobacco Use   Smoking status: Never   Smokeless tobacco: Never  Substance and Sexual Activity   Alcohol use: No   Drug use: No   Sexual activity: Not on file  Other Topics Concern   Not on file  Social History Narrative   He is single, he is developmentally delayed and lives with his parents.   Disabled.   Never smoker no alcohol no drug use 4-5 caffeinated beverages daily.   Social  Determinants of Health   Financial Resource Strain: Not on file  Food Insecurity: Not on file  Transportation Needs: Not on file  Physical Activity: Not on file  Stress: Not on file  Social Connections: Not on file  Intimate Partner Violence: Not At Risk (09/03/2018)   Humiliation, Afraid, Rape, and Kick questionnaire    Fear of Current or Ex-Partner: No    Emotionally Abused: No    Physically Abused: No    Sexually Abused: No    Family History: Family History  Problem Relation Age of Onset   Irritable bowel syndrome Mother    Diabetes Mother        steroids   Interstitial cystitis Maternal Aunt    Heart disease Maternal Aunt    Crohn's disease Maternal Grandmother    Heart disease Maternal Grandmother        great   Pancreatic cancer Maternal Grandfather    Colon cancer Paternal Grandmother    Esophageal cancer Neg Hx    Stomach cancer Neg Hx                                  ALLERGIES:  is allergic to atorvastatin calcium, nystatin, and pravastatin sodium.  Meds: Current Outpatient Medications  Medication Sig Dispense Refill   acetaminophen (TYLENOL) 325 MG tablet 1 tablet as needed Orally every 4 hrs     amLODipine (NORVASC) 5 MG tablet 1 tablet by mouth every morning for 90 days     cetirizine (ZYRTEC) 10 MG chewable tablet 1 tablet     fluticasone (FLONASE) 50 MCG/ACT nasal spray Place 2 sprays into the nose daily.     lansoprazole (PREVACID) 30 MG capsule Take 30 mg by mouth daily.     LORazepam (ATIVAN) 0.5 MG tablet Take 0.5 mg by mouth 2 (two) times daily. (Patient not taking: Reported on 03/12/2023)  0   olmesartan (BENICAR) 40 MG tablet Take 40 mg by mouth daily.     omeprazole (PRILOSEC) 20 MG capsule Take 1 capsule by mouth 2 (two) times daily.     valACYclovir (VALTREX) 1000 MG tablet Take by mouth.     No current facility-administered medications for this visit.    Physical Findings:  vitals were not taken for this visit.   In general this is a well  appearing caucasian male in no acute distress. He is alert and oriented x4 and appropriate throughout the examination. HEENT reveals that the patient is normocephalic, atraumatic. EOMs are intact. PERRLA. Skin is intact without any evidence of gross lesions. Cardiopulmonary assessment is negative for acute distress and he exhibits normal effort. Lymphatic assessment is performed and does not reveal any adenopathy in the inguinal  chains.The abdomen is soft, non tender, non distended. Lower extremities are negative for pretibial pitting edema, deep calf tenderness, cyanosis or clubbing. GU exam reveals normal appearing external male genitalia. No gross lesions are noted. The penis is circumcised and otherwise unremarkable. The scrotum contains left testicle which is generally large without distinct nodularity or tenderness. The right hemiscrotum is empty without palpable spermatic cord. There are no skin changes of the scrotum or groin.   Lab Findings: Lab Results  Component Value Date   WBC 8.0 10/07/2007   HGB 15.5 09/18/2008   HCT 41.1 10/07/2007   MCV 83.9 10/07/2007   PLT 204 10/07/2007     Impression/Plan:  1. 52 y.o. 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  The patient continues to be clinically stable without evidence of disease and his labs have remained stable. We will repeat tumor markers and a PSA today and I will call his mom with the results as soon as they are available.  Since he does not have a urologist, we would recommend he return in 12 months for his next follow up or sooner if he has questions or concerns about his previous treatment, or with any new symptoms.   I personally spent 30 minutes in this encounter including chart review, reviewing radiological studies, meeting face-to-face with the patient, entering orders and completing documentation.     Marguarite Arbour, PA-C

## 2023-07-30 ENCOUNTER — Ambulatory Visit
Admission: RE | Admit: 2023-07-30 | Discharge: 2023-07-30 | Disposition: A | Discharge status: Home / Self Care | Payer: 59 | Source: Ambulatory Visit | Attending: Nurse Practitioner | Admitting: Nurse Practitioner

## 2023-07-30 ENCOUNTER — Ambulatory Visit
Admission: RE | Admit: 2023-07-30 | Discharge: 2023-07-30 | Disposition: A | Discharge status: Home / Self Care | Payer: 59 | Source: Ambulatory Visit | Attending: Urology | Admitting: Urology

## 2023-07-30 ENCOUNTER — Encounter: Payer: Self-pay | Admitting: Urology

## 2023-07-30 VITALS — BP 133/91 | HR 81 | Temp 98.2°F | Resp 20 | Ht 71.0 in | Wt 284.2 lb

## 2023-07-30 DIAGNOSIS — K219 Gastro-esophageal reflux disease without esophagitis: Secondary | ICD-10-CM | POA: Insufficient documentation

## 2023-07-30 DIAGNOSIS — Z8547 Personal history of malignant neoplasm of testis: Secondary | ICD-10-CM | POA: Insufficient documentation

## 2023-07-30 DIAGNOSIS — Z800 Family history of malignant neoplasm of digestive organs: Secondary | ICD-10-CM | POA: Insufficient documentation

## 2023-07-30 DIAGNOSIS — F89 Unspecified disorder of psychological development: Secondary | ICD-10-CM | POA: Insufficient documentation

## 2023-07-30 DIAGNOSIS — E785 Hyperlipidemia, unspecified: Secondary | ICD-10-CM | POA: Insufficient documentation

## 2023-07-30 DIAGNOSIS — Z923 Personal history of irradiation: Secondary | ICD-10-CM | POA: Insufficient documentation

## 2023-07-30 DIAGNOSIS — Z860101 Personal history of adenomatous and serrated colon polyps: Secondary | ICD-10-CM | POA: Insufficient documentation

## 2023-07-30 DIAGNOSIS — Z79899 Other long term (current) drug therapy: Secondary | ICD-10-CM | POA: Insufficient documentation

## 2023-07-30 DIAGNOSIS — K589 Irritable bowel syndrome without diarrhea: Secondary | ICD-10-CM | POA: Insufficient documentation

## 2023-07-30 DIAGNOSIS — C6291 Malignant neoplasm of right testis, unspecified whether descended or undescended: Secondary | ICD-10-CM

## 2023-07-30 DIAGNOSIS — C6211 Malignant neoplasm of descended right testis: Secondary | ICD-10-CM

## 2023-07-30 DIAGNOSIS — I10 Essential (primary) hypertension: Secondary | ICD-10-CM | POA: Insufficient documentation

## 2023-07-30 LAB — LACTATE DEHYDROGENASE: LDH: 168 U/L (ref 98–192)

## 2023-07-30 NOTE — Progress Notes (Signed)
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  Patient identity verified x2. Patient reports doing well. Denies any issues at this time.  Meaningful use complete.  I-PSS score- 11 - Moderate SHIM score- 0- No sexual activity Urinary Management medication(s) None Urology appointment date- Pending - Dr. Patsi Sears (Retired)  Vitals- BP (!) 133/91 (BP Location: Right Wrist, Patient Position: Sitting, Cuff Size: Normal)   Pulse 81   Temp 98.2 F (36.8 C) (Temporal)   Resp 20   Ht 5\' 11"  (1.803 m)   Wt 284 lb 3.2 oz (128.9 kg)   SpO2 96%   BMI 39.64 kg/m   This concludes the interaction.  Ruel Favors, LPN

## 2023-07-31 ENCOUNTER — Telehealth: Payer: Self-pay

## 2023-07-31 LAB — BETA HCG QUANT (REF LAB): hCG Quant: <1 m[IU]/mL (ref 0–3)

## 2023-07-31 LAB — AFP TUMOR MARKER: AFP, Serum, Tumor Marker: 4.8 ng/mL (ref 0.0–6.9)

## 2023-07-31 LAB — PROSTATE-SPECIFIC AG, SERUM (LABCORP): Prostate Specific Ag, Serum: 0.9 ng/mL (ref 0.0–4.0)

## 2023-07-31 NOTE — Telephone Encounter (Signed)
RN called patient mother (Mrs. Liandro Thelin) identity verified.  Per Ashlyn Bruning,PA-C she was informed the labs are all normal and we will plan to see him back in 1 year with repeat labs and exam.  She was appreciative of the call.

## 2023-12-22 ENCOUNTER — Telehealth: Payer: Self-pay | Admitting: *Deleted

## 2023-12-22 NOTE — Telephone Encounter (Signed)
 RETURNED PATIENT'S MOM'S PHONE CALL, SPOKE WITH PATIENT'S MOM- [name] [name]

## 2024-08-03 NOTE — Progress Notes (Incomplete)
 Patient here for his yearly visit. 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   I-PSS score-  SHIM score-  Urinary Management medication(s) None Urology appointment date- ? New urologist   Pain Swelling  Urinary complaints

## 2024-08-04 ENCOUNTER — Other Ambulatory Visit: Payer: Self-pay | Admitting: Urology

## 2024-08-04 ENCOUNTER — Ambulatory Visit: Payer: Self-pay | Admitting: Urology

## 2024-08-04 ENCOUNTER — Ambulatory Visit: Payer: 59

## 2024-08-04 ENCOUNTER — Telehealth: Payer: Self-pay | Admitting: *Deleted

## 2024-08-04 DIAGNOSIS — C6211 Malignant neoplasm of descended right testis: Secondary | ICD-10-CM

## 2024-08-04 NOTE — Telephone Encounter (Signed)
 CALLED PATIENT'S MOM, LORI Gora AND ASKED HER IF LAB AND FU FOR SON WOULD BE OK ON 08-18-24- 1 PM- LAB AND 1:30 PM FU WITH THE PROVIDER, PATIENT'S MOM AGREED TO THESE APPTS.

## 2024-08-15 NOTE — Progress Notes (Signed)
 Follow-up nursing interview for a diagnosis of : 53 y.o. 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   His mother asks that we do not use the term cancer for his care when discussing this in front of him.   Patient identity verified x2. Patient reports doing well. Denies any issues at this time.   Meaningful use complete.   I-PSS score-  4- Mild SHIM score- 0- No sexual activity Urinary Management medication(s) None

## 2024-08-18 ENCOUNTER — Ambulatory Visit
Admission: RE | Admit: 2024-08-18 | Discharge: 2024-08-18 | Disposition: A | Discharge status: Home / Self Care | Payer: Self-pay | Source: Ambulatory Visit | Attending: Urology | Admitting: Urology

## 2024-08-18 ENCOUNTER — Ambulatory Visit
Admission: RE | Admit: 2024-08-18 | Discharge: 2024-08-18 | Disposition: A | Discharge status: Home / Self Care | Source: Ambulatory Visit | Attending: Nurse Practitioner | Admitting: Nurse Practitioner

## 2024-08-18 ENCOUNTER — Encounter: Payer: Self-pay | Admitting: Urology

## 2024-08-18 VITALS — BP 131/87 | HR 85 | Temp 97.7°F | Resp 24 | Ht 73.0 in | Wt 281.0 lb

## 2024-08-18 DIAGNOSIS — R6250 Unspecified lack of expected normal physiological development in childhood: Secondary | ICD-10-CM | POA: Insufficient documentation

## 2024-08-18 DIAGNOSIS — Z79899 Other long term (current) drug therapy: Secondary | ICD-10-CM | POA: Insufficient documentation

## 2024-08-18 DIAGNOSIS — K589 Irritable bowel syndrome without diarrhea: Secondary | ICD-10-CM | POA: Diagnosis not present

## 2024-08-18 DIAGNOSIS — K219 Gastro-esophageal reflux disease without esophagitis: Secondary | ICD-10-CM | POA: Insufficient documentation

## 2024-08-18 DIAGNOSIS — Z8719 Personal history of other diseases of the digestive system: Secondary | ICD-10-CM | POA: Insufficient documentation

## 2024-08-18 DIAGNOSIS — K5900 Constipation, unspecified: Secondary | ICD-10-CM | POA: Insufficient documentation

## 2024-08-18 DIAGNOSIS — E785 Hyperlipidemia, unspecified: Secondary | ICD-10-CM | POA: Insufficient documentation

## 2024-08-18 DIAGNOSIS — I10 Essential (primary) hypertension: Secondary | ICD-10-CM | POA: Diagnosis not present

## 2024-08-18 DIAGNOSIS — Z860101 Personal history of adenomatous and serrated colon polyps: Secondary | ICD-10-CM | POA: Diagnosis not present

## 2024-08-18 DIAGNOSIS — C6291 Malignant neoplasm of right testis, unspecified whether descended or undescended: Secondary | ICD-10-CM

## 2024-08-18 DIAGNOSIS — Z923 Personal history of irradiation: Secondary | ICD-10-CM | POA: Diagnosis not present

## 2024-08-18 DIAGNOSIS — Z8547 Personal history of malignant neoplasm of testis: Secondary | ICD-10-CM | POA: Insufficient documentation

## 2024-08-18 DIAGNOSIS — Z800 Family history of malignant neoplasm of digestive organs: Secondary | ICD-10-CM | POA: Diagnosis not present

## 2024-08-18 DIAGNOSIS — C6211 Malignant neoplasm of descended right testis: Secondary | ICD-10-CM

## 2024-08-18 LAB — LACTATE DEHYDROGENASE: LDH: 166 U/L (ref 98–192)

## 2024-08-18 LAB — PSA: Prostatic Specific Antigen: 1.03 ng/mL (ref 0.00–4.00)

## 2024-08-18 NOTE — Progress Notes (Signed)
 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 He denies pain or discomfort in the scrotum and groin area. His mother asks that we do not use the term cancer for his care when discussing this in front of him.  He reports unchanged urinary frequency, urgency and hesitancy but has not had any issues with incontinence.  His mother reports that he drinks a heavy ammount of fluids throughout the day and right up to the time that he goes to bed so she does not feel that his nocturia is out of proportion to the fluids he is drinking in the evenings.  He has recently started drinking a lot of Pepsi which makes him have to urinate more frequently and he has gained some weight. They are working to help wean him down off the Pepsi. He denies dysuria or gross hematuria.  His normal bowel and bladder patterns involve increased frequency of urination and occasional diarrhea or constipation, unchanged recently. He reports having a good appetite. He has fatigue on occasion when he has been very active. His labs from 07/25/23 were all normal. Germ cell tumor markers and a PSA were drawn today and results are pending.   He had been seeing his PCP, Dr. Gib, annually for physical exam but he retired last year. He is now seeing Dr. Planas [name].  We have provided a recommendation for a local urologist to establish care since he has not been evaluated in urology since Dr. Chales has retired but they have not established care with  urology to date.   On review of systems, the patient reports that he is doing well overall. He denies any chest pain, shortness of breath, cough, fevers, chills, night sweats, or unintended weight loss.  He has no urinary complaints- specifically denies dysuria, gross hematuria, straining to void or incontinence. He reports a healthy appetite and has continued to gain some weight, mainly due to less activity and more Pepsi cola. He denies abdominal pain, nausea or vomiting. He has not had any recent changes in his bladder or bowel function and denies any LUTS. He denies any testicular/scrotal pain or new musculoskeletal or joint aches or pains. A complete review of systems is obtained and is otherwise negative.  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    GERD (gastroesophageal reflux disease)    H/O: hypertension    Helicobacter pylori gastritis    hx of   HLD (hyperlipidemia)    Hx of adenomatous colonic polyps 03/20/2023   03/12/2023 8 adenomas max 15-20 mm recall 2027   IBS (irritable bowel syndrome)    Mentally challenged    Radiation 09/27/08-11/09/2008   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   Testicular cancer [facility])    s/p orchiectomy   Vomiting    self-induced    Past Surgical History: Past Surgical History:  Procedure Laterality Date   COLONOSCOPY     ESOPHAGOGASTRODUODENOSCOPY     NASAL SEPTUM SURGERY  1996   x 2   ORCHIECTOMY  09/18/2008   right inguinal     Social History:  Social History   Socioeconomic History   Marital status: Single    Spouse name: Not on file   Number of children: 0   Years of education: Not on file   Highest education level: Not on file  Occupational History   Occupation: handicapped  Tobacco Use   Smoking status: Never   Smokeless tobacco: Never  Substance and Sexual Activity   Alcohol use: No   Drug use: No   Sexual activity:  Not on file  Other Topics Concern   Not on file  Social History Narrative   He is single, he is developmentally delayed and lives with his parents.   Disabled.   Never smoker no alcohol no drug use 4-5 caffeinated beverages daily.   Social Drivers of Corporate investment banker Strain: Not on file  Food Insecurity: No Food Insecurity (07/30/2023)   Hunger Vital Sign    Worried About Running Out of Food in the Last Year: Never true    Ran Out of Food in the Last Year: Never true  Transportation Needs: No Transportation Needs (07/30/2023)   PRAPARE - Administrator, Civil Service (Medical): No    Lack of Transportation (Non-Medical): No  Physical Activity: Not on file  Stress: Not on file  Social Connections: Not on file  Intimate Partner Violence: Not At Risk (07/30/2023)   Humiliation, Afraid, Rape, and Kick questionnaire    Fear of Current or Ex-Partner: No    Emotionally Abused: No    Physically Abused: No    Sexually Abused: No    Family History: Family History  Problem Relation Age of Onset   Irritable bowel syndrome Mother    Diabetes Mother        steroids   Interstitial cystitis Maternal Aunt    Heart disease Maternal Aunt    Crohn's disease Maternal Grandmother    Heart disease Maternal Grandmother        great   Pancreatic cancer Maternal Grandfather    Colon cancer Paternal Grandmother    Esophageal cancer Neg Hx    Stomach cancer Neg Hx                                  ALLERGIES:  is allergic to atorvastatin calcium, nystatin, and pravastatin sodium.  Meds: Current Outpatient Medications  Medication Sig Dispense Refill   acetaminophen (TYLENOL) 325 MG tablet 1 tablet as needed Orally every 4 hrs     amLODipine (NORVASC) 5 MG tablet 1 tablet by mouth every morning for 90 days     cetirizine (ZYRTEC) 10 MG chewable tablet 1 tablet     fluticasone (FLONASE) 50 MCG/ACT nasal spray Place 2 sprays into the nose daily.     lansoprazole (PREVACID)  30 MG capsule Take 30 mg by mouth daily.     LORazepam (ATIVAN) 0.5 MG tablet Take 0.5 mg by mouth 2 (two) times daily. (Patient not taking: Reported on 03/12/2023)  0   olmesartan (BENICAR) 40 MG tablet Take 40 mg by mouth daily.     omeprazole (PRILOSEC) 20 MG capsule Take 1 capsule by mouth 2 (two) times daily.     valACYclovir (VALTREX) 1000 MG  tablet Take by mouth.     No current facility-administered medications for this visit.    Physical Findings:  vitals were not taken for this visit.   In general this is a well appearing caucasian male in no acute distress. He is alert and oriented x4 and appropriate throughout the examination. HEENT reveals that the patient is normocephalic, atraumatic. EOMs are intact. PERRLA. Skin is intact without any evidence of gross lesions. Cardiopulmonary assessment is negative for acute distress and he exhibits normal effort. Lymphatic assessment is performed and does not reveal any adenopathy in the inguinal chains.The abdomen is soft, non tender, non distended. Lower extremities are negative for pretibial pitting edema, deep calf tenderness, cyanosis or clubbing. GU exam reveals normal appearing external male genitalia. No gross lesions are noted. The penis is circumcised and otherwise unremarkable. The scrotum contains left testicle which is generally large without distinct nodularity or tenderness. The right hemiscrotum is empty without palpable spermatic cord. There are no skin changes of the scrotum or groin.   Lab Findings: Lab Results  Component Value Date   WBC 8.0 10/07/2007   HGB 15.5 09/18/2008   HCT 41.1 10/07/2007   MCV 83.9 10/07/2007   PLT 204 10/07/2007     Impression/Plan:  1. 53 y.o. 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  The patient continues to be clinically stable without evidence of disease and his labs have remained stable. We will repeat tumor markers and a PSA  today and I will call his mom with the results as soon as they are available.  Since he does not have a urologist but is seeing his PCP regularly, we gave them the option to continue annual follow up with us  or follow up with us  prn now that he is 15 years disease free but they prefer to continue to see us  on an every other year basis or sooner if he has questions or concerns about his previous treatment, or with any new symptoms. I will share this discussion with his PCP and will plan to see him back in 07/2026. Hopefully, he can have his tumor markers and PSA drawn annually with his other routine labs with his PCP.   I personally spent 30 minutes in this encounter including chart review, reviewing radiological studies, meeting face-to-face with the patient, entering orders and completing documentation.     Sabra MICAEL Rusk, PA-C

## 2024-08-19 LAB — AFP TUMOR MARKER: AFP, Serum, Tumor Marker: 4.5 ng/mL (ref 0.0–8.4)

## 2024-08-19 LAB — BETA HCG QUANT (REF LAB): hCG Quant: <1 m[IU]/mL (ref 0–3)

## 2024-08-20 ENCOUNTER — Ambulatory Visit
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
## 2024-09-13 ENCOUNTER — Other Ambulatory Visit: Payer: Self-pay

## 2024-09-13 ENCOUNTER — Ambulatory Visit: Attending: Family Medicine

## 2024-09-13 DIAGNOSIS — M5459 Other low back pain: Secondary | ICD-10-CM | POA: Insufficient documentation

## 2024-09-13 DIAGNOSIS — M6281 Muscle weakness (generalized): Secondary | ICD-10-CM | POA: Diagnosis present
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
## 2024-10-11 ENCOUNTER — Telehealth: Payer: Self-pay

## 2024-10-11 ENCOUNTER — Ambulatory Visit: Attending: Family Medicine

## 2024-10-11 NOTE — Telephone Encounter (Signed)
 TC due to missed visit.  VM left to remind of next scheduled appointment.

## 2024-10-13 ENCOUNTER — Ambulatory Visit
